# Patient Record
Sex: Female | Born: 1950 | Race: White | Hispanic: No | Smoking: Former smoker
Health system: Southern US, Community
[De-identification: ages and names within clinical notes are randomized; demographics above are authoritative.]

## PROBLEM LIST (undated history)

## (undated) DIAGNOSIS — E079 Disorder of thyroid, unspecified: Secondary | ICD-10-CM

## (undated) DIAGNOSIS — N390 Urinary tract infection, site not specified: Secondary | ICD-10-CM

## (undated) DIAGNOSIS — M1990 Unspecified osteoarthritis, unspecified site: Secondary | ICD-10-CM

## (undated) DIAGNOSIS — K635 Polyp of colon: Secondary | ICD-10-CM

## (undated) DIAGNOSIS — R011 Cardiac murmur, unspecified: Secondary | ICD-10-CM

## (undated) DIAGNOSIS — M8580 Other specified disorders of bone density and structure, unspecified site: Secondary | ICD-10-CM

## (undated) HISTORY — PX: BREAST CYST ASPIRATION: SHX578

## (undated) HISTORY — PX: TUBAL LIGATION: SHX77

## (undated) HISTORY — PX: FINGER SURGERY: SHX640

## (undated) HISTORY — DX: Polyp of colon: K63.5

## (undated) HISTORY — DX: Urinary tract infection, site not specified: N39.0

## (undated) HISTORY — DX: Disorder of thyroid, unspecified: E07.9

## (undated) HISTORY — PX: WRIST SURGERY: SHX841

## (undated) HISTORY — PX: TONSILLECTOMY: SUR1361

## (undated) HISTORY — PX: BREAST EXCISIONAL BIOPSY: SUR124

## (undated) HISTORY — DX: Unspecified osteoarthritis, unspecified site: M19.90

## (undated) HISTORY — DX: Cardiac murmur, unspecified: R01.1

## (undated) HISTORY — DX: Other specified disorders of bone density and structure, unspecified site: M85.80

---

## 1999-09-24 ENCOUNTER — Encounter: Payer: Self-pay | Admitting: Obstetrics and Gynecology

## 1999-09-24 ENCOUNTER — Encounter: Admission: RE | Admit: 1999-09-24 | Discharge: 1999-09-24 | Payer: Self-pay | Admitting: Obstetrics and Gynecology

## 2006-03-25 ENCOUNTER — Encounter: Admission: RE | Admit: 2006-03-25 | Discharge: 2006-03-25 | Payer: Self-pay | Admitting: Obstetrics and Gynecology

## 2006-09-15 ENCOUNTER — Encounter: Admission: RE | Admit: 2006-09-15 | Discharge: 2006-09-15 | Payer: Self-pay | Admitting: Obstetrics and Gynecology

## 2007-03-18 ENCOUNTER — Encounter: Admission: RE | Admit: 2007-03-18 | Discharge: 2007-03-18 | Payer: Self-pay | Admitting: Obstetrics and Gynecology

## 2008-03-22 ENCOUNTER — Encounter: Admission: RE | Admit: 2008-03-22 | Discharge: 2008-03-22 | Payer: Self-pay | Admitting: Obstetrics and Gynecology

## 2009-03-27 ENCOUNTER — Encounter: Admission: RE | Admit: 2009-03-27 | Discharge: 2009-03-27 | Payer: Self-pay | Admitting: Obstetrics and Gynecology

## 2010-03-28 ENCOUNTER — Encounter
Admission: RE | Admit: 2010-03-28 | Discharge: 2010-03-28 | Payer: Self-pay | Source: Home / Self Care | Attending: Obstetrics and Gynecology | Admitting: Obstetrics and Gynecology

## 2010-06-03 ENCOUNTER — Encounter: Payer: Self-pay | Admitting: Internal Medicine

## 2010-06-04 ENCOUNTER — Encounter (INDEPENDENT_AMBULATORY_CARE_PROVIDER_SITE_OTHER): Payer: Self-pay | Admitting: *Deleted

## 2010-06-06 ENCOUNTER — Encounter: Payer: Self-pay | Admitting: Internal Medicine

## 2010-06-10 NOTE — Letter (Signed)
Summary: Pre Visit Letter Revised  [facility]  [address] [place], [place] [address]   Phone: [phone]  Fax: [phone]        06/03/2010 MRN: [id number]  Paige Rasmussen [address] [place], [place]  [address]             Procedure Date:  06-16-10 1:30pm            Dr  Marina Goodell  Direct Colon  Welcome to the Gastroenterology Division at [facility]    You are scheduled to see a nurse for your pre-procedure visit on 06-06-10 at 4pm on the 3rd floor at [facility], [address]  We ask that you try to arrive at our office 15 minutes prior to your appointment time to allow for check-in.  Please take a minute to review the attached form.  If you answer "Yes" to one or more of the questions on the first page, we ask that you call the person listed at your earliest opportunity.  If you answer "No" to all of the questions, please complete the rest of the form and bring it to your appointment.    Your nurse visit will consist of discussing your medical and surgical history, your immediate family medical history, and your medications.   If you are unable to list all of your medications on the form, please bring the medication bottles to your appointment and we will list them.  We will need to be aware of both prescribed and over the counter drugs.  We will need to know exact dosage information as well.    Please be prepared to read and sign documents such as consent forms, a financial agreement, and acknowledgement forms.  If necessary, and with your consent, a friend or relative is welcome to sit-in on the nurse visit with you.  Please bring your insurance card so that we may make a copy of it.  If your insurance requires a referral to see a specialist, please bring your referral form from your primary care physician.  No co-pay is required for this nurse visit.     If you cannot keep your appointment, please call [phone] to cancel or reschedule prior to  your appointment date.  This allows [place] the opportunity to schedule an appointment for another patient in need of care.    Thank you for choosing [facility] for your medical needs.  We appreciate the opportunity to care for you.  Please visit [place] at our website  to learn more about our practice.  Sincerely, The Gastroenterology Division

## 2010-06-10 NOTE — Letter (Signed)
Summary: Moviprep Instructions  [facility]  [address]   [place], [place] [address]   Phone: [phone]  Fax: [phone]       Paige Rasmussen    1951-04-12    MRN: [id number]        Procedure Day /Date: Monday, 06-16-10     Arrival Time: 12:30 p.m.     Procedure Time: 1:30 p.m.     Location of Procedure:                    x   [facility] (4th Floor)   PREPARATION FOR COLONOSCOPY WITH MOVIPREP   Starting 5 days prior to your procedure 06-11-10 do not eat nuts, seeds, popcorn, corn, beans, peas,  salads, or any raw vegetables.  Do not take any fiber supplements (e.g. Metamucil, Citrucel, and Benefiber).  THE DAY BEFORE YOUR PROCEDURE         DATE: 06-15-10   DAY: Sunday  1.  Drink clear liquids the entire day-NO SOLID FOOD  2.  Do not drink anything colored red or purple.  Avoid juices with pulp.  No orange juice.  3.  Drink at least 64 oz. (8 glasses) of fluid/clear liquids during the day to prevent dehydration and help the prep work efficiently.  CLEAR LIQUIDS INCLUDE: Water Jello Ice Popsicles Tea (sugar ok, no milk/cream) Powdered fruit flavored drinks Coffee (sugar ok, no milk/cream) Gatorade Juice: apple, white grape, white cranberry  Lemonade Clear bullion, consomm, broth Carbonated beverages (any kind) Strained chicken noodle soup Hard Candy                             4.  In the morning, mix first dose of MoviPrep solution:    Empty 1 Pouch A and 1 Pouch B into the disposable container    Add lukewarm drinking water to the top line of the container. Mix to dissolve    Refrigerate (mixed solution should be used within 24 hrs)  5.  Begin drinking the prep at 5:00 p.m. The MoviPrep container is divided by 4 marks.   Every 15 minutes drink the solution down to the next mark (approximately 8 oz) until the full liter is complete.   6.  Follow completed prep with 16 oz of clear liquid of your choice (Nothing red or purple).   Continue to drink clear liquids until bedtime.  7.  Before going to bed, mix second dose of MoviPrep solution:    Empty 1 Pouch A and 1 Pouch B into the disposable container    Add lukewarm drinking water to the top line of the container. Mix to dissolve           Refrigerate THE DAY OF YOUR PROCEDURE      DATE: 06-16-10  DAY: Monday  Beginning at 8:30 a.m. (5 hours before procedure):         1. Every 15 minutes, drink the solution down to the next mark (approx 8 oz) until the full liter is complete.  2. Follow completed prep with 16 oz. of clear liquid of your choice.    3. You may drink clear liquids until 11:30 a.m. (2 HOURS BEFORE PROCEDURE).   MEDICATION INSTRUCTIONS  Unless otherwise instructed, you should take regular prescription medications with a small sip of water   as early as possible the morning of your procedure.          OTHER INSTRUCTIONS  You will need a responsible adult at least 62 years of age to accompany you and drive you home.   This person must remain in the waiting room during your procedure.  Wear loose fitting clothing that is easily removed.  Leave jewelry and other valuables at home.  However, you may wish to bring a book to read or  an iPod/MP3 player to listen to music as you wait for your procedure to start.  Remove all body piercing jewelry and leave at home.  Total time from sign-in until discharge is approximately 2-3 hours.  You should go home directly after your procedure and rest.  You can resume normal activities the  day after your procedure.  The day of your procedure you should not:   Drive   Make legal decisions   Operate machinery   Drink alcohol   Return to work  You will receive specific instructions about eating, activities and medications before you leave.    The above instructions have been reviewed and explained to me by   Ezra Sites RN  June 06, 2010 4:23 PM     I fully understand and can  verbalize these instructions _____________________________ Date _________

## 2010-06-10 NOTE — Miscellaneous (Signed)
Summary: [facility]  Clinical Lists Changes  Medications: Added new medication of MOVIPREP 100 GM  SOLR (PEG-KCL-NACL-NASULF-NA ASC-C) As per prep instructions. - Signed Rx of MOVIPREP 100 GM  SOLR (PEG-KCL-NACL-NASULF-NA ASC-C) As per prep instructions.;  #1 x 0;  Signed;  Entered by: Ezra Sites RN;  Authorized by: Hilarie Fredrickson MD;  Method used: Electronically to [facility] [address] # [id number]*, [address] [place], [place], [place]  [address], Ph: [phone] or [phone], Fax: [phone] Allergies: Added new allergy or adverse reaction of PCN Added new allergy or adverse reaction of CODEINE Observations: Added new observation of NKA: F (06/06/2010 15:57)    Prescriptions: MOVIPREP 100 GM  SOLR (PEG-KCL-NACL-NASULF-NA ASC-C) As per prep instructions.  #1 x 0   Entered by:   Ezra Sites RN   Authorized by:   Hilarie Fredrickson MD   Signed by:   Ezra Sites RN on 06/06/2010   Method used:   Electronically to        [facility] [address] [id number]* (retail)       [address]       [place], [place]  [address]       Ph: [phone] or [phone]       Fax: [phone]   RxID:   [id number]

## 2010-06-16 ENCOUNTER — Other Ambulatory Visit: Payer: Self-pay | Admitting: Internal Medicine

## 2010-06-16 ENCOUNTER — Other Ambulatory Visit (AMBULATORY_SURGERY_CENTER): Payer: PRIVATE HEALTH INSURANCE | Admitting: Internal Medicine

## 2010-06-16 DIAGNOSIS — D126 Benign neoplasm of colon, unspecified: Secondary | ICD-10-CM

## 2010-06-16 DIAGNOSIS — Z1211 Encounter for screening for malignant neoplasm of colon: Secondary | ICD-10-CM

## 2010-06-16 DIAGNOSIS — K5730 Diverticulosis of large intestine without perforation or abscess without bleeding: Secondary | ICD-10-CM

## 2010-06-24 NOTE — Procedures (Addendum)
Summary: Colonoscopy  Patient: [name] Note: All result statuses are Final unless otherwise noted.  Tests: (1) Colonoscopy (COL)   COL Colonoscopy           DONE     [facility]     [address]     [place], [place]  [address]          COLONOSCOPY PROCEDURE REPORT          PATIENT:  Paige Rasmussen, Paige Rasmussen  MR#:  [id number]     BIRTHDATE:  [birth year], 59 yrs. old  GENDER:  female     ENDOSCOPIST:  Wilhemina Bonito. Eda Keys, MD     REF. BY:  Kyra Manges, M.D.     PROCEDURE DATE:  06/16/2010     PROCEDURE:  Colonoscopy with snare polypectomy x 1     ASA CLASS:  Class II     INDICATIONS:  Routine Risk Screening     MEDICATIONS:   Fentanyl 100 mcg IV, Versed 10 mg IV          DESCRIPTION OF PROCEDURE:   After the risks benefits and     alternatives of the procedure were thoroughly explained, informed     consent was obtained.  Digital rectal exam was performed and     revealed no abnormalities.   The LB 180AL [id number] endoscope was     introduced through the anus and advanced to the cecum, which was     identified by both the appendix and ileocecal valve, without     limitations.Time to cecu  = 6:13 min. The quality of the prep was     excellent, using MoviPrep.  The instrument was then slowly     withdrawn (time = 12:24 min) as the colon was fully examined.     <<PROCEDUREIMAGES>>          FINDINGS:  A diminutive polyp was found in the ascending colon.     Polyp was snared without cautery. Retrieval was successful.  Mild     diverticulosis was found in the sigmoid colon.  Otherwise normal     colonoscopy without other polyps, masses, vascular ectasias, or     inflammatory changes.   Retroflexed views in the rectum revealed     no abnormalities.    The scope was then withdrawn from the patient     and the procedure completed.          COMPLICATIONS:  None          ENDOSCOPIC IMPRESSION:     1) Diminutive polyp in the ascending colon - removed     2) Mild  diverticulosis in the sigmoid colon     3) Otherwise normal colonoscopy          RECOMMENDATIONS:     1) Repeat colonoscopy in 5 years if polyp adenomatous; otherwise     10 years          ______________________________     Wilhemina Bonito. Eda Keys, MD          CC:  Kyra Manges, MD;  The Patient          n.     eSIGNED:   Wilhemina Bonito. Eda Keys at 06/16/2010 02:17 PM          Shanon Payor, [id number]  Note: An exclamation mark (!) indicates a result that was not dispersed into the flowsheet. Document Creation Date: 06/16/2010 2:18 PM _______________________________________________________________________  (1) Order result status: Final  Collection or observation date-time: 06/16/2010 14:12 Requested date-time:  Receipt date-time:  Reported date-time:  Referring Physician:   Ordering Physician: Fransico Setters [id number]) Specimen Source:  Source: Launa Grill Order Number: [id number] Lab site:   Appended Document: Colonoscopy     Procedures Next Due Date:    Colonoscopy: 06/2015

## 2010-06-28 ENCOUNTER — Encounter: Payer: Self-pay | Admitting: Internal Medicine

## 2010-07-01 NOTE — Letter (Addendum)
Summary: Patient Notice- Polyp Results   Gastroenterology  [address] [place], [place] [address]   Phone: [phone]  Fax: [phone]        June 28, 2010 MRN: [id number]    Paige Rasmussen [address] [place], [place]  [address]    Dear Ms. Clauson,  I am pleased to inform you that the colon polyp(s) removed during your recent colonoscopy was (were) found to be benign (no cancer detected) upon pathologic examination.  I recommend you have a repeat colonoscopy examination in 5 years to look for recurrent polyps, as having colon polyps increases your risk for having recurrent polyps or even colon cancer in the future.  Should you develop new or worsening symptoms of abdominal pain, bowel habit changes or bleeding from the rectum or bowels, please schedule an evaluation with either your primary care physician or with me.  Additional information/recommendations:  __ No further action with gastroenterology is needed at this time. Please      follow-up with your primary care physician for your other healthcare      needs. .  Please call us if you are having persistent problems or have questions about your condition that have not been fully answered at this time.  Sincerely,  Hilarie Fredrickson MD  This letter has been electronically signed by your physician.  Appended Document: Patient Notice- Polyp Results letter mailed

## 2011-02-24 ENCOUNTER — Other Ambulatory Visit: Payer: Self-pay | Admitting: Physician Assistant

## 2011-02-24 DIAGNOSIS — Z1231 Encounter for screening mammogram for malignant neoplasm of breast: Secondary | ICD-10-CM

## 2011-03-31 ENCOUNTER — Ambulatory Visit
Admission: RE | Admit: 2011-03-31 | Discharge: 2011-03-31 | Disposition: A | Discharge status: Home / Self Care | Payer: PRIVATE HEALTH INSURANCE | Source: Ambulatory Visit | Attending: Physician Assistant | Admitting: Physician Assistant

## 2011-03-31 DIAGNOSIS — Z1231 Encounter for screening mammogram for malignant neoplasm of breast: Secondary | ICD-10-CM

## 2012-02-24 ENCOUNTER — Other Ambulatory Visit: Payer: Self-pay | Admitting: Physician Assistant

## 2012-02-24 DIAGNOSIS — Z1231 Encounter for screening mammogram for malignant neoplasm of breast: Secondary | ICD-10-CM

## 2012-04-04 ENCOUNTER — Ambulatory Visit
Admission: RE | Admit: 2012-04-04 | Discharge: 2012-04-04 | Disposition: A | Discharge status: Home / Self Care | Payer: BC Managed Care – PPO | Source: Ambulatory Visit | Attending: Physician Assistant | Admitting: Physician Assistant

## 2012-04-04 DIAGNOSIS — Z1231 Encounter for screening mammogram for malignant neoplasm of breast: Secondary | ICD-10-CM

## 2012-11-18 ENCOUNTER — Other Ambulatory Visit: Payer: Self-pay | Admitting: Physician Assistant

## 2012-11-18 NOTE — Telephone Encounter (Signed)
Medication refilled per protocol. 

## 2012-11-29 ENCOUNTER — Ambulatory Visit (INDEPENDENT_AMBULATORY_CARE_PROVIDER_SITE_OTHER): Payer: BC Managed Care – PPO | Admitting: Family Medicine

## 2012-11-29 ENCOUNTER — Encounter: Payer: Self-pay | Admitting: Family Medicine

## 2012-11-29 VITALS — BP 160/90 | HR 76 | Temp 98.0°F | Resp 18 | Ht 64.5 in | Wt 152.0 lb

## 2012-11-29 DIAGNOSIS — IMO0001 Reserved for inherently not codable concepts without codable children: Secondary | ICD-10-CM

## 2012-11-29 DIAGNOSIS — E039 Hypothyroidism, unspecified: Secondary | ICD-10-CM | POA: Insufficient documentation

## 2012-11-29 DIAGNOSIS — R030 Elevated blood-pressure reading, without diagnosis of hypertension: Secondary | ICD-10-CM

## 2012-11-29 DIAGNOSIS — R300 Dysuria: Secondary | ICD-10-CM

## 2012-11-29 DIAGNOSIS — N390 Urinary tract infection, site not specified: Secondary | ICD-10-CM

## 2012-11-29 LAB — URINALYSIS, ROUTINE W REFLEX MICROSCOPIC
Ketones, ur: NEGATIVE mg/dL
Nitrite: NEGATIVE
Specific Gravity, Urine: 1.01 (ref 1.005–1.030)
Urobilinogen, UA: 0.2 mg/dL (ref 0.0–1.0)

## 2012-11-29 LAB — URINALYSIS, MICROSCOPIC ONLY
Bacteria, UA: NONE SEEN
Crystals: NONE SEEN

## 2012-11-29 MED ORDER — CIPROFLOXACIN HCL 500 MG PO TABS: 500.0000 mg | ORAL_TABLET | Freq: Two times a day (BID) | ORAL | Status: DC

## 2012-11-29 MED ORDER — PHENAZOPYRIDINE HCL 100 MG PO TABS: 100.0000 mg | ORAL_TABLET | Freq: Three times a day (TID) | ORAL | Status: DC | PRN

## 2012-11-29 NOTE — Assessment & Plan Note (Signed)
BP elevated today, reviewed her chart BP typically 130/80 past few years, under a lot of stress, no weight gain, normal labs in December  Will recheck in 6 weeks and decide if medications needed

## 2012-11-29 NOTE — Progress Notes (Signed)
  Subjective:    Patient ID: Paige Rasmussen, female    DOB: 02/20/1951, 62 y.o.   MRN: [id number]  HPI  Patient here with dysuria and urinary pressure for the past 2 weeks. She's been taking cranberry juice at home. She denies any abdominal pain nausea vomiting. She denies any fever. Medications reviewed she's taking multivitamins as well as her Synthroid [place] and has been under a larger slightly also helping with her father-in-law who was recently diagnosed with mild dementia. Seeing Dr. Mina Marble for fractured finger, has splint on  Review of Systems  GEN- denies fatigue, fever, weight loss,weakness, recent illness CVS- denies chest pain, palpitations RESP- denies SOB, cough, wheeze ABD- denies N/V, change in stools, abd pain GU- + dysuria, denies hematuria, dribbling, incontinence Neuro- denies headache, dizziness, syncope, seizure activity      Objective:   Physical Exam GEN- NAD, alert and oriented x3 Neck- Supple, no thryomegaly CVS- RRR, no murmur RESP-CTAB ABD-NABS,soft,NT,ND,no CVA tenderness EXT- No edema Pulses- Radial 2+  Splint in 4th digit left hand      Assessment & Plan:

## 2012-11-29 NOTE — Patient Instructions (Signed)
Take the antibiotics as prescribed  Drink plenty of water F/U 6 weeks for blood pressure

## 2012-11-29 NOTE — Assessment & Plan Note (Signed)
Treat with Cipro and pyridium for the pressure

## 2013-01-10 ENCOUNTER — Ambulatory Visit (INDEPENDENT_AMBULATORY_CARE_PROVIDER_SITE_OTHER): Payer: BC Managed Care – PPO | Admitting: Family Medicine

## 2013-01-10 ENCOUNTER — Encounter: Payer: Self-pay | Admitting: Family Medicine

## 2013-01-10 VITALS — BP 130/78 | HR 80 | Temp 97.9°F | Resp 18 | Ht 65.0 in | Wt 155.0 lb

## 2013-01-10 DIAGNOSIS — E039 Hypothyroidism, unspecified: Secondary | ICD-10-CM

## 2013-01-10 DIAGNOSIS — IMO0001 Reserved for inherently not codable concepts without codable children: Secondary | ICD-10-CM

## 2013-01-10 DIAGNOSIS — R030 Elevated blood-pressure reading, without diagnosis of hypertension: Secondary | ICD-10-CM

## 2013-01-10 DIAGNOSIS — Z0000 Encounter for general adult medical examination without abnormal findings: Secondary | ICD-10-CM

## 2013-01-10 MED ORDER — LEVOTHYROXINE SODIUM 75 MCG PO TABS: 75.0000 ug | ORAL_TABLET | Freq: Every day | ORAL | Status: DC

## 2013-01-10 NOTE — Assessment & Plan Note (Signed)
Repeat blood pressure much improved, home readings normal

## 2013-01-10 NOTE — Progress Notes (Signed)
  Subjective:    Patient ID: Paige Rasmussen, female    DOB: 1950-12-03, 62 y.o.   MRN: [id number]  HPI Patient here to followup elevated blood pressure at last visit. She's been checking blood pressure at home her systolic is ranging 120s to 130s over 70s to 80s. She has no diagnoses of high blood pressure.  She is also inquiring when her last physical was on her last colonoscopy was She also is history of hypothyroidism and her Synthroid is causing her arm is $30 a month brand name she would like to change this to generic levothyroxine   Review of Systems  GEN- denies fatigue, fever, weight loss,weakness, recent illness HEENT- denies eye drainage, change in vision, nasal discharge, CVS- denies chest pain, palpitations RESP- denies SOB, cough, wheeze ABD- denies N/V, change in stools, abd pain GU- denies dysuria, hematuria, dribbling, incontinence MSK- denies joint pain, muscle aches, injury Neuro- denies headache, dizziness, syncope, seizure activity      Objective:   Physical Exam GEN- NAD, alert and oriented x3 CVS- RRR, no murmur RESP-CTAB Pulses- Radial 2+        Assessment & Plan:

## 2013-01-10 NOTE — Assessment & Plan Note (Signed)
Change to levothyroxine REcheck labs in 3 months before CPE   PAP Smear will not be needed Colonoscopy UTD,

## 2013-04-14 ENCOUNTER — Encounter: Payer: BC Managed Care – PPO | Admitting: Family Medicine

## 2013-05-02 ENCOUNTER — Encounter: Payer: Self-pay | Admitting: Family Medicine

## 2013-05-04 ENCOUNTER — Other Ambulatory Visit: Payer: BC Managed Care – PPO

## 2013-05-04 DIAGNOSIS — Z0000 Encounter for general adult medical examination without abnormal findings: Secondary | ICD-10-CM

## 2013-05-04 DIAGNOSIS — E039 Hypothyroidism, unspecified: Secondary | ICD-10-CM

## 2013-05-04 LAB — CBC WITH DIFFERENTIAL/PLATELET
BASOS PCT: 0 % (ref 0–1)
Basophils Absolute: 0 10*3/uL (ref 0.0–0.1)
EOS ABS: 0.4 10*3/uL (ref 0.0–0.7)
EOS PCT: 5 % (ref 0–5)
HEMATOCRIT: 37 % (ref 36.0–46.0)
HEMOGLOBIN: 12.4 g/dL (ref 12.0–15.0)
LYMPHS ABS: 1.6 10*3/uL (ref 0.7–4.0)
Lymphocytes Relative: 21 % (ref 12–46)
MCH: 29.5 pg (ref 26.0–34.0)
MCHC: 33.5 g/dL (ref 30.0–36.0)
MCV: 88.1 fL (ref 78.0–100.0)
MONO ABS: 0.6 10*3/uL (ref 0.1–1.0)
Monocytes Relative: 8 % (ref 3–12)
Neutro Abs: 5 10*3/uL (ref 1.7–7.7)
Neutrophils Relative %: 66 % (ref 43–77)
Platelets: 237 10*3/uL (ref 150–400)
RBC: 4.2 MIL/uL (ref 3.87–5.11)
RDW: 13.7 % (ref 11.5–15.5)
WBC: 7.6 10*3/uL (ref 4.0–10.5)

## 2013-05-04 LAB — COMPREHENSIVE METABOLIC PANEL
ALBUMIN: 4 g/dL (ref 3.5–5.2)
ALK PHOS: 68 U/L (ref 39–117)
ALT: 11 U/L (ref 0–35)
AST: 20 U/L (ref 0–37)
BILIRUBIN TOTAL: 0.4 mg/dL (ref 0.3–1.2)
BUN: 12 mg/dL (ref 6–23)
CO2: 29 mEq/L (ref 19–32)
CREATININE: 0.66 mg/dL (ref 0.50–1.10)
Calcium: 9.4 mg/dL (ref 8.4–10.5)
Chloride: 104 mEq/L (ref 96–112)
Glucose, Bld: 92 mg/dL (ref 70–99)
POTASSIUM: 4.8 meq/L (ref 3.5–5.3)
Sodium: 141 mEq/L (ref 135–145)
Total Protein: 6.7 g/dL (ref 6.0–8.3)

## 2013-05-04 LAB — LIPID PANEL
CHOL/HDL RATIO: 3 ratio
Cholesterol: 193 mg/dL (ref 0–200)
HDL: 65 mg/dL (ref >39)
LDL CALC: 107 mg/dL — AB (ref 0–99)
Triglycerides: 105 mg/dL (ref <150)
VLDL: 21 mg/dL (ref 0–40)

## 2013-05-04 LAB — T3, FREE: T3 FREE: 2.8 pg/mL (ref 2.3–4.2)

## 2013-05-04 LAB — TSH: TSH: 5.081 u[IU]/mL — ABNORMAL HIGH (ref 0.350–4.500)

## 2013-05-04 LAB — T4, FREE: FREE T4: 1.34 ng/dL (ref 0.80–1.80)

## 2013-05-05 ENCOUNTER — Encounter: Payer: BC Managed Care – PPO | Admitting: Family Medicine

## 2013-05-10 ENCOUNTER — Encounter: Payer: Self-pay | Admitting: Family Medicine

## 2013-05-10 ENCOUNTER — Ambulatory Visit (INDEPENDENT_AMBULATORY_CARE_PROVIDER_SITE_OTHER): Payer: BC Managed Care – PPO | Admitting: Family Medicine

## 2013-05-10 ENCOUNTER — Other Ambulatory Visit: Payer: Self-pay

## 2013-05-10 VITALS — BP 120/78 | HR 82 | Temp 98.2°F | Resp 18 | Ht 66.0 in | Wt 156.0 lb

## 2013-05-10 DIAGNOSIS — E039 Hypothyroidism, unspecified: Secondary | ICD-10-CM

## 2013-05-10 DIAGNOSIS — Z1231 Encounter for screening mammogram for malignant neoplasm of breast: Secondary | ICD-10-CM

## 2013-05-10 DIAGNOSIS — Z13820 Encounter for screening for osteoporosis: Secondary | ICD-10-CM

## 2013-05-10 DIAGNOSIS — Z0000 Encounter for general adult medical examination without abnormal findings: Secondary | ICD-10-CM

## 2013-05-10 MED ORDER — LEVOTHYROXINE SODIUM 88 MCG PO TABS: 88.0000 ug | ORAL_TABLET | Freq: Every day | ORAL | Status: DC

## 2013-05-10 NOTE — Progress Notes (Signed)
   Subjective:    Patient ID: Paige Rasmussen, female    DOB: 10/01/1950, 63 y.o.   MRN: [id number]  HPI History here for complete physical exam. She has no specific concerns. She's been taking her thyroid medication as prescribed. She is due to review her fasting labs. Her immunizations are up-to-date. She does not have a mammogram in 2014 because she received a letter from her insurance stated they would only pay for it every 2 years. She's not had a bone density test. She is taking calcium and vitamin D on a regular basis and tries to stay active. She had a recent Pap smear that was normal therefore she is not due for Pap today Colonoscopy up to date   Review of Systems   GEN- denies fatigue, fever, weight loss,weakness, recent illness HEENT- denies eye drainage, change in vision, nasal discharge, CVS- denies chest pain, palpitations RESP- denies SOB, cough, wheeze ABD- denies N/V, change in stools, abd pain GU- denies dysuria, hematuria, dribbling, incontinence MSK- denies joint pain, muscle aches, injury Neuro- denies headache, dizziness, syncope, seizure activity      Objective:   Physical Exam GEN- NAD, alert and oriented x3 HEENT- PERRL, EOMI, non injected sclera, pink conjunctiva, MMM, oropharynx clear Neck- Supple, no thryomegaly CVS- RRR, no murmur RESP-CTAB ABD-NABS,soft,NT,ND GU- Deferred EXT- No edema Pulses- Radial, DP- 2+        Assessment & Plan:    CPE- PAP not neeed until 2016, Colonoscopy due 2017, pt to check with insurance will need Mammogram and Bone Density. Reviewed fasting labs  Immunizations UTD

## 2013-05-10 NOTE — Patient Instructions (Signed)
New dose of synthroid medication Work on low fat diet  Look into Mammogram and Bone Density F/U 6 weeks for lab for repeat testing

## 2013-05-11 NOTE — Assessment & Plan Note (Signed)
She has had some mild fatigue recently with her elevated TSH I will increase her Synthroid to 88 mcg and will return in 6 weeks to have her labs repeated

## 2013-05-22 ENCOUNTER — Other Ambulatory Visit: Payer: BC Managed Care – PPO

## 2013-06-01 ENCOUNTER — Ambulatory Visit: Payer: BC Managed Care – PPO

## 2013-06-01 ENCOUNTER — Ambulatory Visit
Admission: RE | Admit: 2013-06-01 | Discharge: 2013-06-01 | Disposition: A | Discharge status: Home / Self Care | Payer: BC Managed Care – PPO | Source: Ambulatory Visit

## 2013-06-01 DIAGNOSIS — Z1231 Encounter for screening mammogram for malignant neoplasm of breast: Secondary | ICD-10-CM

## 2013-06-21 ENCOUNTER — Other Ambulatory Visit: Payer: BC Managed Care – PPO

## 2013-06-21 DIAGNOSIS — E039 Hypothyroidism, unspecified: Secondary | ICD-10-CM

## 2013-06-21 LAB — T3, FREE: T3, Free: 3 pg/mL (ref 2.3–4.2)

## 2013-06-21 LAB — TSH: TSH: 3.323 u[IU]/mL (ref 0.350–4.500)

## 2013-06-22 ENCOUNTER — Encounter: Payer: Self-pay | Admitting: *Deleted

## 2013-11-07 ENCOUNTER — Other Ambulatory Visit: Payer: Self-pay | Admitting: *Deleted

## 2013-11-07 DIAGNOSIS — E039 Hypothyroidism, unspecified: Secondary | ICD-10-CM

## 2013-11-07 MED ORDER — LEVOTHYROXINE SODIUM 88 MCG PO TABS: 88.0000 ug | ORAL_TABLET | Freq: Every day | ORAL | Status: DC

## 2013-11-07 NOTE — Telephone Encounter (Signed)
Refill appropriate and filled per protocol. 

## 2014-02-02 ENCOUNTER — Encounter: Payer: Self-pay | Admitting: Family Medicine

## 2014-02-13 ENCOUNTER — Other Ambulatory Visit: Payer: Self-pay | Admitting: *Deleted

## 2014-02-13 DIAGNOSIS — E039 Hypothyroidism, unspecified: Secondary | ICD-10-CM

## 2014-02-13 MED ORDER — LEVOTHYROXINE SODIUM 88 MCG PO TABS: 88.0000 ug | ORAL_TABLET | Freq: Every day | ORAL | Status: DC

## 2014-02-13 NOTE — Telephone Encounter (Signed)
Received fax requesting refill on levothyroxine.   Refill appropriate and filled per protocol.

## 2014-06-06 ENCOUNTER — Encounter: Payer: Self-pay | Admitting: Family Medicine

## 2014-06-11 ENCOUNTER — Other Ambulatory Visit: Payer: PPO

## 2014-06-11 ENCOUNTER — Other Ambulatory Visit: Payer: Self-pay | Admitting: Family Medicine

## 2014-06-11 DIAGNOSIS — IMO0001 Reserved for inherently not codable concepts without codable children: Secondary | ICD-10-CM

## 2014-06-11 DIAGNOSIS — Z79899 Other long term (current) drug therapy: Secondary | ICD-10-CM

## 2014-06-11 DIAGNOSIS — E039 Hypothyroidism, unspecified: Secondary | ICD-10-CM

## 2014-06-11 DIAGNOSIS — R030 Elevated blood-pressure reading, without diagnosis of hypertension: Principal | ICD-10-CM

## 2014-06-11 DIAGNOSIS — Z0000 Encounter for general adult medical examination without abnormal findings: Secondary | ICD-10-CM

## 2014-06-11 LAB — COMPLETE METABOLIC PANEL WITH GFR
ALT: 22 U/L (ref 0–35)
AST: 23 U/L (ref 0–37)
Albumin: 4.2 g/dL (ref 3.5–5.2)
Alkaline Phosphatase: 67 U/L (ref 39–117)
BILIRUBIN TOTAL: 0.3 mg/dL (ref 0.2–1.2)
BUN: 11 mg/dL (ref 6–23)
CALCIUM: 10.2 mg/dL (ref 8.4–10.5)
CO2: 26 meq/L (ref 19–32)
CREATININE: 0.71 mg/dL (ref 0.50–1.10)
Chloride: 102 mEq/L (ref 96–112)
GLUCOSE: 99 mg/dL (ref 70–99)
Potassium: 4.5 mEq/L (ref 3.5–5.3)
Sodium: 140 mEq/L (ref 135–145)
TOTAL PROTEIN: 6.9 g/dL (ref 6.0–8.3)

## 2014-06-11 LAB — CBC WITH DIFFERENTIAL/PLATELET
BASOS ABS: 0 10*3/uL (ref 0.0–0.1)
Basophils Relative: 0 % (ref 0–1)
EOS PCT: 3 % (ref 0–5)
Eosinophils Absolute: 0.2 10*3/uL (ref 0.0–0.7)
HCT: 38.9 % (ref 36.0–46.0)
Hemoglobin: 12.7 g/dL (ref 12.0–15.0)
LYMPHS PCT: 29 % (ref 12–46)
Lymphs Abs: 1.9 10*3/uL (ref 0.7–4.0)
MCH: 29.3 pg (ref 26.0–34.0)
MCHC: 32.6 g/dL (ref 30.0–36.0)
MCV: 89.8 fL (ref 78.0–100.0)
MONO ABS: 0.5 10*3/uL (ref 0.1–1.0)
MONOS PCT: 8 % (ref 3–12)
MPV: 9 fL (ref 8.6–12.4)
NEUTROS ABS: 3.9 10*3/uL (ref 1.7–7.7)
Neutrophils Relative %: 60 % (ref 43–77)
PLATELETS: 277 10*3/uL (ref 150–400)
RBC: 4.33 MIL/uL (ref 3.87–5.11)
RDW: 13.3 % (ref 11.5–15.5)
WBC: 6.5 10*3/uL (ref 4.0–10.5)

## 2014-06-11 LAB — LIPID PANEL
Cholesterol: 190 mg/dL (ref 0–200)
HDL: 50 mg/dL (ref >=46)
LDL CALC: 113 mg/dL — AB (ref 0–99)
TRIGLYCERIDES: 135 mg/dL (ref <150)
Total CHOL/HDL Ratio: 3.8 Ratio
VLDL: 27 mg/dL (ref 0–40)

## 2014-06-11 LAB — TSH: TSH: 5.636 u[IU]/mL — AB (ref 0.350–4.500)

## 2014-06-12 ENCOUNTER — Other Ambulatory Visit: Payer: Self-pay | Admitting: *Deleted

## 2014-06-12 DIAGNOSIS — E039 Hypothyroidism, unspecified: Secondary | ICD-10-CM

## 2014-06-12 LAB — T3, FREE: T3 FREE: 2.7 pg/mL (ref 2.3–4.2)

## 2014-06-12 LAB — T4, FREE: FREE T4: 1.43 ng/dL (ref 0.80–1.80)

## 2014-06-12 MED ORDER — LEVOTHYROXINE SODIUM 88 MCG PO TABS: 88.0000 ug | ORAL_TABLET | Freq: Every day | ORAL | Status: DC

## 2014-06-12 NOTE — Telephone Encounter (Signed)
Medication filled x1 with no refills.   Requires office visit before any further refills can be given.   Letter sent.  

## 2014-06-18 ENCOUNTER — Ambulatory Visit (INDEPENDENT_AMBULATORY_CARE_PROVIDER_SITE_OTHER): Payer: PPO | Admitting: Family Medicine

## 2014-06-18 ENCOUNTER — Encounter: Payer: Self-pay | Admitting: Family Medicine

## 2014-06-18 ENCOUNTER — Other Ambulatory Visit: Payer: Self-pay | Admitting: Family Medicine

## 2014-06-18 VITALS — BP 122/68 | HR 74 | Temp 98.4°F | Resp 14 | Ht 66.0 in | Wt 158.0 lb

## 2014-06-18 DIAGNOSIS — E039 Hypothyroidism, unspecified: Secondary | ICD-10-CM | POA: Diagnosis not present

## 2014-06-18 DIAGNOSIS — Z0000 Encounter for general adult medical examination without abnormal findings: Secondary | ICD-10-CM | POA: Diagnosis not present

## 2014-06-18 DIAGNOSIS — M8580 Other specified disorders of bone density and structure, unspecified site: Secondary | ICD-10-CM

## 2014-06-18 DIAGNOSIS — Z13820 Encounter for screening for osteoporosis: Secondary | ICD-10-CM | POA: Diagnosis not present

## 2014-06-18 DIAGNOSIS — Z124 Encounter for screening for malignant neoplasm of cervix: Secondary | ICD-10-CM

## 2014-06-18 DIAGNOSIS — E038 Other specified hypothyroidism: Secondary | ICD-10-CM | POA: Diagnosis not present

## 2014-06-18 DIAGNOSIS — Z23 Encounter for immunization: Secondary | ICD-10-CM

## 2014-06-18 DIAGNOSIS — J302 Other seasonal allergic rhinitis: Secondary | ICD-10-CM | POA: Diagnosis not present

## 2014-06-18 DIAGNOSIS — Z1239 Encounter for other screening for malignant neoplasm of breast: Secondary | ICD-10-CM | POA: Diagnosis not present

## 2014-06-18 MED ORDER — LEVOTHYROXINE SODIUM 100 MCG PO TABS: 100.0000 ug | ORAL_TABLET | Freq: Every day | ORAL | Status: DC

## 2014-06-18 NOTE — Assessment & Plan Note (Signed)
Increase to 170mcg once a day, repeat  TFT in 8 weeks

## 2014-06-18 NOTE — Progress Notes (Signed)
Patient ID: Paige Rasmussen, female   DOB: [birth year], 64 y.o.   MRN: [id number]    Subjective:   Patient presents for Medicare Annual/Subsequent preventive examination.    Patient here for annual physical/wellness exam. She turns 64 recently. She is due for Pap smear her last was 3 years ago. She is also due for bone density. She is also due for Prevnar 13.  She has history of hypothyroidism she's currently on Synthroid 88 g. She has felt more fatigued recently which feels a little abnormal for her. She denies any chest pain shortness of breath palpitations. There is been no significant weight changes. No changes with heat or cold intolerance. Her last reviewed which did show abnormality of her thyroid function tests Review Past Medical/Family/Social: Per EMR   Risk Factors  Current exercise habits: walks Dietary issues discussed: Yes  Cardiac risk factors: None   Depression Screen  (Note: if answer to either of the following is "Yes", a more complete depression screening is indicated)  Over the past two weeks, have you felt down, depressed or hopeless? No Over the past two weeks, have you felt little interest or pleasure in doing things? No Have you lost interest or pleasure in daily life? No Do you often feel hopeless? No Do you cry easily over simple problems? No   Activities of Daily Living  In your present state of health, do you have any difficulty performing the following activities?:  Driving? No  Managing money? No  Feeding yourself? No  Getting from bed to chair? No  Climbing a flight of stairs? No  Preparing food and eating?: No  Bathing or showering? No  Getting dressed: No  Getting to the toilet? No  Using the toilet:No  Moving around from place to place: No  In the past year have you fallen or had a near fall?:No  Are you sexually active? No  Do you have more than one partner? No   Hearing Difficulties: No  Do you often ask people to speak up or repeat  themselves? No  Do you experience ringing or noises in your ears? No Do you have difficulty understanding soft or whispered voices? No  Do you feel that you have a problem with memory? No Do you often misplace items? No  Do you feel safe at home? Yes  Cognitive Testing  Alert? Yes Normal Appearance?Yes  Oriented to person? Yes Place? Yes  Time? Yes  Recall of three objects? Yes  Can perform simple calculations? Yes  Displays appropriate judgment?Yes  Can read the correct time from a watch face?Yes   List the Names of Other Physician/Practitioners you currently use:  NONE  Screening Tests / Date Colonoscopy     -UTD                Zostavax UTD Mammogram -DUE Tetanus/tdap -UTD  ROS: GEN- + fatigue, denies fever, weight loss,weakness, recent illness HEENT- denies eye drainage, change in vision, +nasal discharge, CVS- denies chest pain, palpitations RESP- denies SOB, cough, wheeze ABD- denies N/V, change in stools, abd pain GU- denies dysuria, hematuria, dribbling, incontinence MSK- denies joint pain, muscle aches, injury Neuro- denies headache, dizziness, syncope, seizure activity  PHYSICAL: GEN- NAD, alert and oriented x3 HEENT- PERRL, EOMI, non injected sclera, pink conjunctiva, MMM, oropharynx clear Neck- Supple, no thryomegaly Breast- normal symmetry, no nipple inversion,no nipple drainage, no nodules or lumps felt Nodes- no axillary nodes CVS- RRR, soft 1/6 SEM left sternal border RESP-CTAB ABD-NABS,soft,NT,ND GU- normal  external genitalia, vaginal mucosa pink and moist, cervix visualized no growth, no blood form os+ atrophy of cervix, no CMT, no ovarian masses, uterus normal size EXT- No edema Pulses- Radial, DP- 2+    Assessment:    Annual wellness medicare exam   Plan:    During the course of the visit the patient was educated and counseled about appropriate screening and preventive services including:  Screening mammography  Colorectal cancer screening  - due 2017 prevar 13 given  Bone density to be scheduled  No further PAP if normal this time due to age   Patient Instructions (the written plan) was given to the patient.  Medicare Attestation  I have personally reviewed:  The patient's medical and social history  Their use of alcohol, tobacco or illicit drugs  Their current medications and supplements  The patient's functional ability including ADLs,fall risks, home safety risks, cognitive, and hearing and visual impairment  Diet and physical activities  Evidence for depression or mood disorders  The patient's weight, height, BMI, and visual acuity have been recorded in the chart. I have made referrals, counseling, and provided education to the patient based on review of the above and I have provided the patient with a written personalized care plan for preventive services.

## 2014-06-18 NOTE — Patient Instructions (Signed)
Try zyrtec or claritin for allergies Synthroid increased to 163mcg once a day  Return in 8 weeks for repeat thyroid labs Call and schedule your Bone Density and Mammogram Prevnar 13 given today  F/U 1 year or as needed

## 2014-06-19 ENCOUNTER — Encounter: Payer: Self-pay | Admitting: *Deleted

## 2014-06-19 LAB — PAP THINPREP ASCUS RFLX HPV RFLX TYPE

## 2014-06-21 ENCOUNTER — Other Ambulatory Visit: Payer: Self-pay | Admitting: *Deleted

## 2014-06-21 DIAGNOSIS — E039 Hypothyroidism, unspecified: Secondary | ICD-10-CM

## 2014-06-21 MED ORDER — LEVOTHYROXINE SODIUM 100 MCG PO TABS: 100.0000 ug | ORAL_TABLET | Freq: Every day | ORAL | Status: DC

## 2014-06-21 NOTE — Telephone Encounter (Signed)
Received fax requesting refill on Synthroid with 90 day supply.   Refill appropriate and filled per protocol.

## 2014-06-29 ENCOUNTER — Ambulatory Visit
Admission: RE | Admit: 2014-06-29 | Discharge: 2014-06-29 | Disposition: A | Discharge status: Home / Self Care | Payer: PPO | Source: Ambulatory Visit | Attending: Family Medicine | Admitting: Family Medicine

## 2014-06-29 DIAGNOSIS — M8580 Other specified disorders of bone density and structure, unspecified site: Secondary | ICD-10-CM

## 2014-06-29 DIAGNOSIS — Z1239 Encounter for other screening for malignant neoplasm of breast: Secondary | ICD-10-CM

## 2014-07-02 ENCOUNTER — Encounter: Payer: Self-pay | Admitting: Family Medicine

## 2014-07-02 ENCOUNTER — Encounter: Payer: Self-pay | Admitting: *Deleted

## 2014-07-09 ENCOUNTER — Encounter: Payer: Self-pay | Admitting: *Deleted

## 2014-08-13 ENCOUNTER — Other Ambulatory Visit: Payer: PPO

## 2014-08-13 DIAGNOSIS — E038 Other specified hypothyroidism: Secondary | ICD-10-CM

## 2014-08-13 LAB — T4, FREE: Free T4: 1.46 ng/dL (ref 0.80–1.80)

## 2014-08-13 LAB — TSH: TSH: 2.561 u[IU]/mL (ref 0.350–4.500)

## 2014-08-13 LAB — T3, FREE: T3, Free: 3 pg/mL (ref 2.3–4.2)

## 2015-01-24 ENCOUNTER — Ambulatory Visit (INDEPENDENT_AMBULATORY_CARE_PROVIDER_SITE_OTHER): Payer: PPO | Admitting: *Deleted

## 2015-01-24 DIAGNOSIS — Z23 Encounter for immunization: Secondary | ICD-10-CM

## 2015-02-08 ENCOUNTER — Telehealth: Payer: Self-pay | Admitting: Family Medicine

## 2015-02-08 DIAGNOSIS — E039 Hypothyroidism, unspecified: Secondary | ICD-10-CM

## 2015-02-08 MED ORDER — LEVOTHYROXINE SODIUM 100 MCG PO TABS: 100.0000 ug | ORAL_TABLET | Freq: Every day | ORAL | Status: DC

## 2015-02-08 NOTE — Telephone Encounter (Signed)
Refill appropriate and filled per protocol.  Call placed to patient and patient made aware.  

## 2015-02-08 NOTE — Telephone Encounter (Signed)
PHARMACY: [facility]   MEDICATION: SYNTHROID   QTY:    SIG:    PHYSICIAN: [place]   PT. PHONE #: [phone]  Pt called and states that the pharmacy faxed a rx request to [place] yesterday, but she is leaving to go out of the country tomorrow and is hoping to have enough to get her through next week.

## 2015-02-12 ENCOUNTER — Other Ambulatory Visit: Payer: Self-pay | Admitting: *Deleted

## 2015-02-12 DIAGNOSIS — E039 Hypothyroidism, unspecified: Secondary | ICD-10-CM

## 2015-02-12 MED ORDER — LEVOTHYROXINE SODIUM 100 MCG PO TABS: 100.0000 ug | ORAL_TABLET | Freq: Every day | ORAL | Status: DC

## 2015-02-12 NOTE — Telephone Encounter (Signed)
Received fax requesting refill on levothyroxine.   Refill appropriate and filled per protocol.

## 2015-05-20 ENCOUNTER — Other Ambulatory Visit: Payer: Self-pay

## 2015-05-20 DIAGNOSIS — Z1231 Encounter for screening mammogram for malignant neoplasm of breast: Secondary | ICD-10-CM

## 2015-06-21 ENCOUNTER — Other Ambulatory Visit: Payer: Self-pay | Admitting: Family Medicine

## 2015-06-21 ENCOUNTER — Other Ambulatory Visit: Payer: PPO

## 2015-06-21 DIAGNOSIS — Z79899 Other long term (current) drug therapy: Secondary | ICD-10-CM

## 2015-06-21 DIAGNOSIS — Z0000 Encounter for general adult medical examination without abnormal findings: Secondary | ICD-10-CM

## 2015-06-21 DIAGNOSIS — M8580 Other specified disorders of bone density and structure, unspecified site: Secondary | ICD-10-CM

## 2015-06-21 DIAGNOSIS — E039 Hypothyroidism, unspecified: Secondary | ICD-10-CM | POA: Diagnosis not present

## 2015-06-21 DIAGNOSIS — E559 Vitamin D deficiency, unspecified: Secondary | ICD-10-CM | POA: Diagnosis not present

## 2015-06-22 LAB — CBC WITH DIFFERENTIAL/PLATELET
BASOS ABS: 0 10*3/uL (ref 0.0–0.1)
BASOS PCT: 0 % (ref 0–1)
EOS ABS: 0.3 10*3/uL (ref 0.0–0.7)
EOS PCT: 4 % (ref 0–5)
HCT: 39.2 % (ref 36.0–46.0)
Hemoglobin: 12.6 g/dL (ref 12.0–15.0)
LYMPHS ABS: 1.8 10*3/uL (ref 0.7–4.0)
Lymphocytes Relative: 27 % (ref 12–46)
MCH: 29 pg (ref 26.0–34.0)
MCHC: 32.1 g/dL (ref 30.0–36.0)
MCV: 90.1 fL (ref 78.0–100.0)
MPV: 9.4 fL (ref 8.6–12.4)
Monocytes Absolute: 0.6 10*3/uL (ref 0.1–1.0)
Monocytes Relative: 9 % (ref 3–12)
Neutro Abs: 4 10*3/uL (ref 1.7–7.7)
Neutrophils Relative %: 60 % (ref 43–77)
PLATELETS: 266 10*3/uL (ref 150–400)
RBC: 4.35 MIL/uL (ref 3.87–5.11)
RDW: 13.3 % (ref 11.5–15.5)
WBC: 6.6 10*3/uL (ref 4.0–10.5)

## 2015-06-22 LAB — COMPLETE METABOLIC PANEL WITH GFR
ALT: 11 U/L (ref 6–29)
AST: 16 U/L (ref 10–35)
Albumin: 4.3 g/dL (ref 3.6–5.1)
Alkaline Phosphatase: 68 U/L (ref 33–130)
BILIRUBIN TOTAL: 0.4 mg/dL (ref 0.2–1.2)
BUN: 13 mg/dL (ref 7–25)
CALCIUM: 9.7 mg/dL (ref 8.6–10.4)
CHLORIDE: 100 mmol/L (ref 98–110)
CO2: 27 mmol/L (ref 20–31)
Creat: 0.66 mg/dL (ref 0.50–0.99)
GFR, Est Non African American: >89 mL/min (ref >=60)
Glucose, Bld: 95 mg/dL (ref 70–99)
Potassium: 4.6 mmol/L (ref 3.5–5.3)
Sodium: 141 mmol/L (ref 135–146)
Total Protein: 7.3 g/dL (ref 6.1–8.1)

## 2015-06-22 LAB — LIPID PANEL
CHOL/HDL RATIO: 3.8 ratio (ref <=5.0)
CHOLESTEROL: 200 mg/dL (ref 125–200)
HDL: 53 mg/dL (ref >=46)
LDL CALC: 119 mg/dL (ref <130)
Triglycerides: 139 mg/dL (ref <150)
VLDL: 28 mg/dL (ref <30)

## 2015-06-22 LAB — VITAMIN D 25 HYDROXY (VIT D DEFICIENCY, FRACTURES): VIT D 25 HYDROXY: 31 ng/mL (ref 30–100)

## 2015-06-22 LAB — TSH: TSH: 3.47 mIU/L

## 2015-06-25 ENCOUNTER — Encounter: Payer: Self-pay | Admitting: Internal Medicine

## 2015-06-25 ENCOUNTER — Ambulatory Visit (INDEPENDENT_AMBULATORY_CARE_PROVIDER_SITE_OTHER): Payer: PPO | Admitting: Family Medicine

## 2015-06-25 ENCOUNTER — Encounter: Payer: Self-pay | Admitting: Family Medicine

## 2015-06-25 VITALS — BP 136/72 | HR 74 | Temp 98.5°F | Resp 12 | Ht 66.0 in | Wt 166.0 lb

## 2015-06-25 DIAGNOSIS — Z1211 Encounter for screening for malignant neoplasm of colon: Secondary | ICD-10-CM

## 2015-06-25 DIAGNOSIS — Z86010 Personal history of colonic polyps: Secondary | ICD-10-CM | POA: Diagnosis not present

## 2015-06-25 DIAGNOSIS — Z23 Encounter for immunization: Secondary | ICD-10-CM

## 2015-06-25 DIAGNOSIS — E038 Other specified hypothyroidism: Secondary | ICD-10-CM

## 2015-06-25 DIAGNOSIS — Z0000 Encounter for general adult medical examination without abnormal findings: Secondary | ICD-10-CM

## 2015-06-25 NOTE — Assessment & Plan Note (Signed)
Levels within normal limits, no changes

## 2015-06-25 NOTE — Progress Notes (Signed)
Patient ID: Paige Rasmussen, female   DOB: [birth year], 65 y.o.   MRN: [id number] Subjective:   Patient presents for Medicare Annual/Subsequent preventive examination.  Patient here for annual wellness exam. She has no particular concerns. Her fasting labs are reviewed at the bedside.  When we expanded about her depression screening she states that she is upset with herself because of the week she is gaining. She feels depressed when she looks at me air. Her weight is up 8 pounds from her physical last year. She admits to eating out a lot and also snacking late at night when she is watching TV. She states her husband won't exercise with her. She found a friend who is willing to walk with her when the weather gets warm. Review Past Medical/Family/Social: per EMR  Risk Factors  Current exercise habits: none Dietary issues discussed: Yes  Cardiac risk factors: None  Depression Screen  (Note: if answer to either of the following is "Yes", a more complete depression screening is indicated)  Over the past two weeks, have you felt down, depressed or hopeless?Yes Over the past two weeks, have you felt little interest or pleasure in doing things? No Have you lost interest or pleasure in daily life? No Do you often feel hopeless? No Do you cry easily over simple problems? No   Activities of Daily Living  In your present state of health, do you have any difficulty performing the following activities?:  Driving? No  Managing money? No  Feeding yourself? No  Getting from bed to chair? No  Climbing a flight of stairs? No  Preparing food and eating?: No  Bathing or showering? No  Getting dressed: No  Getting to the toilet? No  Using the toilet:No  Moving around from place to place: No  In the past year have you fallen or had a near fall?:No  Are you sexually active? No  Do you have more than one partner? No   Hearing Difficulties: No  Do you often ask people to speak up or repeat themselves?  No  Do you experience ringing or noises in your ears? No Do you have difficulty understanding soft or whispered voices? No  Do you feel that you have a problem with memory? No Do you often misplace items? No  Do you feel safe at home? Yes  Cognitive Testing  Alert? Yes Normal Appearance?Yes  Oriented to person? Yes Place? Yes  Time? Yes  Recall of three objects? Yes  Can perform simple calculations? Yes  Displays appropriate judgment?Yes  Can read the correct time from a watch face?Yes   List the Names of Other Physician/Practitioners you currently use:    Indicate any recent Medical Services you may have received from other than [facility] providers in the past year (date may be approximate).   Screening Tests / Date Colonoscopy    - Due                  Zostavax UTD Mammogram UTD Influenza Vaccine UTD Tetanus/tdap UTD   ROS: GEN- denies fatigue, fever, weight loss,weakness, recent illness HEENT- denies eye drainage, change in vision, nasal discharge, CVS- denies chest pain, palpitations RESP- denies SOB, cough, wheeze ABD- denies N/V, change in stools, abd pain GU- denies dysuria, hematuria, dribbling, incontinence MSK- denies joint pain, muscle aches, injury Neuro- denies headache, dizziness, syncope, seizure activity    PHYSICAL: GEN- NAD, alert and oriented x3 HEENT- PERRL, EOMI, non injected sclera, pink conjunctiva, MMM, oropharynx clear Neck- Supple,  no thryomegaly CVS- RRR, soft 1/6 SEM left sternal border RESP-CTAB ABD-NABS,soft,NT,ND Psych- normal affect and mood  EXT- No edema Pulses- Radial, DP- 2+   Assessment:    Annual wellness medicare exam   Plan:    During the course of the visit the patient was educated and counseled about appropriate screening and preventive services including:  Screening mammography  Colorectal cancer screening - to be set up  Pneumovax 23 given  Screen + for depression. More centered around her weight, we brainstormed  ways to change her diet and get her mor active, so she does not stress over her weight.    Patient Instructions (the written plan) was given to the patient.  Medicare Attestation  I have personally reviewed:  The patient's medical and social history  Their use of alcohol, tobacco or illicit drugs  Their current medications and supplements  The patient's functional ability including ADLs,fall risks, home safety risks, cognitive, and hearing and visual impairment  Diet and physical activities  Evidence for depression or mood disorders  The patient's weight, height, BMI, and visual acuity have been recorded in the chart. I have made referrals, counseling, and provided education to the patient based on review of the above and I have provided the patient with a written personalized care plan for preventive services.

## 2015-06-25 NOTE — Patient Instructions (Signed)
Get mammogram Referral to Dr. Henrene Pastor- for colonoscopy  Calcium take 2  Pneumonia shot given  F/U 1 year

## 2015-07-01 ENCOUNTER — Ambulatory Visit
Admission: RE | Admit: 2015-07-01 | Discharge: 2015-07-01 | Disposition: A | Discharge status: Home / Self Care | Payer: PPO | Source: Ambulatory Visit

## 2015-07-01 DIAGNOSIS — Z1231 Encounter for screening mammogram for malignant neoplasm of breast: Secondary | ICD-10-CM | POA: Diagnosis not present

## 2015-08-20 ENCOUNTER — Ambulatory Visit (AMBULATORY_SURGERY_CENTER): Payer: Self-pay

## 2015-08-20 VITALS — Ht 66.0 in | Wt 161.0 lb

## 2015-08-20 DIAGNOSIS — Z86010 Personal history of colonic polyps: Secondary | ICD-10-CM

## 2015-08-20 MED ORDER — NA SULFATE-K SULFATE-MG SULF 17.5-3.13-1.6 GM/177ML PO SOLN: ORAL | Status: DC

## 2015-08-20 NOTE — Progress Notes (Signed)
Per pt, no allergies to soy or egg products.Pt not taking any weight loss meds or using  O2 at home. 

## 2015-08-21 ENCOUNTER — Encounter: Payer: Self-pay | Admitting: Internal Medicine

## 2015-09-03 ENCOUNTER — Ambulatory Visit (AMBULATORY_SURGERY_CENTER): Payer: PPO | Admitting: Internal Medicine

## 2015-09-03 ENCOUNTER — Encounter: Payer: Self-pay | Admitting: Internal Medicine

## 2015-09-03 VITALS — BP 126/71 | HR 71 | Temp 97.8°F | Resp 17 | Ht 66.0 in | Wt 161.0 lb

## 2015-09-03 DIAGNOSIS — E039 Hypothyroidism, unspecified: Secondary | ICD-10-CM | POA: Diagnosis not present

## 2015-09-03 DIAGNOSIS — Z86010 Personal history of colonic polyps: Secondary | ICD-10-CM

## 2015-09-03 MED ORDER — SODIUM CHLORIDE 0.9 % IV SOLN: 500.0000 mL | INTRAVENOUS | Status: DC

## 2015-09-03 NOTE — Op Note (Signed)
[place] Patient Name: Paige Rasmussen Procedure Date: 09/03/2015 9:08 AM MRN: [id number] Endoscopist: Docia Chuck. Henrene Pastor , MD Age: 65 Referring MD:  Date of Birth: 01-19-1951 Gender: Female Procedure:                Colonoscopy Indications:              Surveillance: Personal history of adenomatous                            polyps on last colonoscopy 5 years ago. Index exam                            March 2012 with small tubular adenoma Medicines:                Monitored Anesthesia Care Procedure:                Pre-Anesthesia Assessment:                           - Prior to the procedure, a History and Physical                            was performed, and patient medications and                            allergies were reviewed. The patient's tolerance of                            previous anesthesia was also reviewed. The risks                            and benefits of the procedure and the sedation                            options and risks were discussed with the patient.                            All questions were answered, and informed consent                            was obtained. Prior Anticoagulants: The patient has                            taken no previous anticoagulant or antiplatelet                            agents. ASA Grade Assessment: II - A patient with                            mild systemic disease. After reviewing the risks                            and benefits, the patient was deemed in  satisfactory condition to undergo the procedure.                           After obtaining informed consent, the colonoscope                            was passed under direct vision. Throughout the                            procedure, the patient's blood pressure, pulse, and                            oxygen saturations were monitored continuously. The                            Model CF-HQ190L [id number]) scope was  introduced                            through the anus and advanced to the the cecum,                            identified by appendiceal orifice and ileocecal                            valve. The ileocecal valve, appendiceal orifice,                            and rectum were photographed. The quality of the                            bowel preparation was excellent. The colonoscopy                            was performed without difficulty. The patient                            tolerated the procedure well. The bowel preparation                            used was SUPREP. Scope In: 9:12:30 AM Scope Out: 9:30:35 AM Scope Withdrawal Time: 0 hours 13 minutes 32 seconds  Total Procedure Duration: 0 hours 18 minutes 5 seconds  Findings:                 A few diverticula were found in the sigmoid colon.                           The exam was otherwise without abnormality on                            direct and retroflexion views. Complications:            No immediate complications. Estimated blood loss:  None. Estimated Blood Loss:     Estimated blood loss: none. Impression:               - Diverticulosis in the sigmoid colon.                           - The examination was otherwise normal on direct                            and retroflexion views.                           - No specimens collected. Recommendation:           - Repeat colonoscopy in 10 years for surveillance.                           - Patient has a contact number available for                            emergencies. The signs and symptoms of potential                            delayed complications were discussed with the                            patient. Return to normal activities tomorrow.                            Written discharge instructions were provided to the                            patient.                           - Resume previous diet.                           -  Continue present medications. Docia Chuck. Henrene Pastor, MD 09/03/2015 9:33:54 AM This report has been signed electronically.

## 2015-09-03 NOTE — Progress Notes (Signed)
Report to PACU, RN, vss, BBS= Clear.  

## 2015-09-03 NOTE — Patient Instructions (Signed)
YOU HAD AN ENDOSCOPIC PROCEDURE TODAY AT [facility]:   Refer to the procedure report that was given to you for any specific questions about what was found during the examination.  If the procedure report does not answer your questions, please call your gastroenterologist to clarify.  If you requested that your care partner not be given the details of your procedure findings, then the procedure report has been included in a sealed envelope for you to review at your convenience later.  YOU SHOULD EXPECT: Some feelings of bloating in the abdomen. Passage of more gas than usual.  Walking can help get rid of the air that was put into your GI tract during the procedure and reduce the bloating. If you had a lower endoscopy (such as a colonoscopy or flexible sigmoidoscopy) you may notice spotting of blood in your stool or on the toilet paper. If you underwent a bowel prep for your procedure, you may not have a normal bowel movement for a few days.  Please Note:  You might notice some irritation and congestion in your nose or some drainage.  This is from the oxygen used during your procedure.  There is no need for concern and it should clear up in a day or so.  SYMPTOMS TO REPORT IMMEDIATELY:   Following lower endoscopy (colonoscopy or flexible sigmoidoscopy):  Excessive amounts of blood in the stool  Significant tenderness or worsening of abdominal pains  Swelling of the abdomen that is new, acute  Fever of 100F or higher  der blades  PFor urgent or emergent issues, a gastroenterologist can be reached at any hour by calling [phone].   DIET: Your first meal following the procedure should be a small meal and then it is ok to progress to your normal diet. Heavy or fried foods are harder to digest and may make you feel nauseous or bloated.  Likewise, meals heavy in dairy and vegetables can increase bloating.  Drink plenty of fluids but you should avoid alcoholic beverages for 24  hours.  ACTIVITY:  You should plan to take it easy for the rest of today and you should NOT DRIVE or use heavy machinery until tomorrow (because of the sedation medicines used during the test).    FOLLOW UP: Our staff will call the number listed on your records the next business day following your procedure to check on you and address any questions or concerns that you may have regarding the information given to you following your procedure. If we do not reach you, we will leave a message.  However, if you are feeling well and you are not experiencing any problems, there is no need to return our call.  We will assume that you have returned to your regular daily activities without incident.  If any biopsies were taken you will be contacted by phone or by letter within the next 1-3 weeks.  Please call us at [phone] if you have not heard about the biopsies in 3 weeks.    SIGNATURES/CONFIDENTIALITY: You and/or your care partner have signed paperwork which will be entered into your electronic medical record.  These signatures attest to the fact that that the information above on your After Visit Summary has been reviewed and is understood.  Full responsibility of the confidentiality of this discharge information lies with you and/or your care-partner.  Diverticulosis and high fiber diet information given.

## 2015-09-04 ENCOUNTER — Telehealth: Payer: Self-pay

## 2015-09-04 NOTE — Telephone Encounter (Signed)
  Follow up Call-  Call back number 09/03/2015  Post procedure Call Back phone  # [phone]  Permission to leave phone message Yes    Patient was called for follow up after her procedure on 09/03/2015. No answer at the number given for follow up phone call. A message was left on the answering machine.

## 2015-10-16 ENCOUNTER — Telehealth: Payer: Self-pay | Admitting: Family Medicine

## 2015-10-16 NOTE — Telephone Encounter (Signed)
Received fax from [facility] back Auth# [id number] Treating Provider Scarlette Shorts MD # of Visits 1 Start Date 09/03/15 End Date 03/01/16 CPT 45378 DX Z86.010

## 2016-01-13 ENCOUNTER — Other Ambulatory Visit: Payer: Self-pay | Admitting: *Deleted

## 2016-01-13 MED ORDER — LEVOTHYROXINE SODIUM 100 MCG PO TABS: 100.0000 ug | ORAL_TABLET | Freq: Every day | ORAL | 1 refills | Status: DC

## 2016-01-13 NOTE — Telephone Encounter (Signed)
Received call from patient.   Requested refill on Synthroid.   Refill appropriate and filled per protocol.

## 2016-03-03 ENCOUNTER — Ambulatory Visit (INDEPENDENT_AMBULATORY_CARE_PROVIDER_SITE_OTHER): Payer: PPO | Admitting: Family Medicine

## 2016-03-03 DIAGNOSIS — Z23 Encounter for immunization: Secondary | ICD-10-CM

## 2016-04-20 ENCOUNTER — Encounter: Payer: Self-pay | Admitting: Family Medicine

## 2016-04-20 ENCOUNTER — Ambulatory Visit (INDEPENDENT_AMBULATORY_CARE_PROVIDER_SITE_OTHER): Payer: PPO | Admitting: Family Medicine

## 2016-04-20 VITALS — BP 162/86 | HR 98 | Temp 98.7°F | Resp 16 | Ht 67.0 in | Wt 153.0 lb

## 2016-04-20 DIAGNOSIS — J4521 Mild intermittent asthma with (acute) exacerbation: Secondary | ICD-10-CM | POA: Diagnosis not present

## 2016-04-20 MED ORDER — PREDNISONE 20 MG PO TABS: ORAL_TABLET | ORAL | 0 refills | Status: DC

## 2016-04-20 MED ORDER — BENZONATATE 100 MG PO CAPS: 200.0000 mg | ORAL_CAPSULE | Freq: Three times a day (TID) | ORAL | 0 refills | Status: DC | PRN

## 2016-04-20 MED ORDER — AZITHROMYCIN 250 MG PO TABS: ORAL_TABLET | ORAL | 0 refills | Status: DC

## 2016-04-20 NOTE — Progress Notes (Signed)
   Subjective:    Patient ID: Paige Rasmussen, female    DOB: [birth year], 66 y.o.   MRN: [id number]  HPI Patient reports a cough productive of green and brown sputum for approximately 3 weeks. She reports some wheezing and chest congestion. She reports subjective fevers. She has a 15-pack-year history of smoking although no documented history of emphysema. However on examination today, the patient has diffuse inspiratory wheezing with rhonchorous breath sounds left greater than right posteriorly. She denies any rhinorrhea or sore throat and she denies any head congestion. She denies any pleurisy or hemoptysis Past Medical History:  Diagnosis Date  . 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 Osteopenia   . Thyroid disease    Past Surgical History:  Procedure Laterality Date  . FINGER SURGERY     left ring finger  . TUBAL LIGATION    . WRIST SURGERY     left wrist   Current Outpatient Prescriptions on File Prior to Visit  Medication Sig Dispense Refill  . aspirin 81 MG tablet Take 81 mg by mouth daily.    . Calcium Carbonate-Vitamin D (CALCIUM-VITAMIN D) 500-200 MG-UNIT per tablet Take 2 tablets by mouth. Take 2 pills daily    . glucosamine-chondroitin 500-400 MG tablet Take 1 tablet by mouth daily.     Marland Kitchen levothyroxine (SYNTHROID, LEVOTHROID) 100 MCG tablet Take 1 tablet (100 mcg total) by mouth daily. 90 tablet 1  . vitamin C (ASCORBIC ACID) 500 MG tablet Take 1,000 mg by mouth daily.      No current facility-administered medications on file prior to visit.    Allergies  Allergen Reactions  . Penicillins     REACTION: eyes, lips, and joints swell  . Codeine     REACTION: nausea and vomiting  . Oxycodone Itching   Social History   Social History  . Marital status: Married    Spouse name: N/A  . Number of children: N/A  . Years of education: N/A   Occupational History  . Not on file.   Social History Main Topics  . Smoking status: Former Smoker    Types: Cigarettes    Quit date:  11/30/1982  . Smokeless tobacco: Never Used  . Alcohol use No  . Drug use: No  . Sexual activity: Not on file   Other Topics Concern  . Not on file   Social History Narrative  . No narrative on file      Review of Systems  All other systems reviewed and are negative.      Objective:   Physical Exam  Cardiovascular: Normal rate, regular rhythm and normal heart sounds.   Pulmonary/Chest: No accessory muscle usage. No respiratory distress. She has wheezes in the right upper field, the right lower field, the left upper field and the left lower field. She has rhonchi.  Vitals reviewed.         Assessment & Plan:  Mild intermittent asthmatic bronchitis with acute exacerbation - Plan: azithromycin (ZITHROMAX) 250 MG tablet, predniSONE (DELTASONE) 20 MG tablet, benzonatate (TESSALON) 100 MG capsule  I'll start the patient on a prednisone taper pack to address to bronchospasms I'm hearing on her exam. Uses Z-Pak for bronchitis. Use Tessalon 200 mg every 8 hours as needed for cough. Recheck in one week or sooner if worse. If no better, proceed with a chest x-ray

## 2016-06-17 DIAGNOSIS — H524 Presbyopia: Secondary | ICD-10-CM | POA: Diagnosis not present

## 2016-06-24 ENCOUNTER — Other Ambulatory Visit: Payer: PPO

## 2016-06-24 ENCOUNTER — Other Ambulatory Visit: Payer: Self-pay | Admitting: Family Medicine

## 2016-06-24 DIAGNOSIS — E039 Hypothyroidism, unspecified: Secondary | ICD-10-CM

## 2016-06-24 DIAGNOSIS — Z79899 Other long term (current) drug therapy: Secondary | ICD-10-CM

## 2016-06-24 DIAGNOSIS — Z0000 Encounter for general adult medical examination without abnormal findings: Secondary | ICD-10-CM | POA: Diagnosis not present

## 2016-06-24 LAB — CBC WITH DIFFERENTIAL/PLATELET
BASOS ABS: 0 {cells}/uL (ref 0–200)
Basophils Relative: 0 %
EOS ABS: 270 {cells}/uL (ref 15–500)
EOS PCT: 5 %
HCT: 38.2 % (ref 35.0–45.0)
HEMOGLOBIN: 12.7 g/dL (ref 12.0–15.0)
LYMPHS ABS: 1674 {cells}/uL (ref 850–3900)
Lymphocytes Relative: 31 %
MCH: 29.5 pg (ref 27.0–33.0)
MCHC: 33.2 g/dL (ref 32.0–36.0)
MCV: 88.8 fL (ref 80.0–100.0)
MPV: 9.2 fL (ref 7.5–12.5)
Monocytes Absolute: 648 cells/uL (ref 200–950)
Monocytes Relative: 12 %
Neutro Abs: 2808 cells/uL (ref 1500–7800)
Neutrophils Relative %: 52 %
Platelets: 237 10*3/uL (ref 140–400)
RBC: 4.3 MIL/uL (ref 3.80–5.10)
RDW: 14.3 % (ref 11.0–15.0)
WBC: 5.4 10*3/uL (ref 3.8–10.8)

## 2016-06-24 LAB — COMPLETE METABOLIC PANEL WITH GFR
ALBUMIN: 4.3 g/dL (ref 3.6–5.1)
ALK PHOS: 62 U/L (ref 33–130)
ALT: 16 U/L (ref 6–29)
AST: 18 U/L (ref 10–35)
BUN: 12 mg/dL (ref 7–25)
CO2: 30 mmol/L (ref 20–31)
Calcium: 9.6 mg/dL (ref 8.6–10.4)
Chloride: 102 mmol/L (ref 98–110)
Creat: 0.68 mg/dL (ref 0.50–0.99)
GFR, Est African American: >89 mL/min (ref >=60)
GLUCOSE: 109 mg/dL — AB (ref 70–99)
POTASSIUM: 4.5 mmol/L (ref 3.5–5.3)
SODIUM: 141 mmol/L (ref 135–146)
Total Bilirubin: 0.4 mg/dL (ref 0.2–1.2)
Total Protein: 7.2 g/dL (ref 6.1–8.1)

## 2016-06-24 LAB — LIPID PANEL
CHOLESTEROL: 194 mg/dL (ref <200)
HDL: 57 mg/dL (ref >50)
LDL CALC: 115 mg/dL — AB (ref <100)
Total CHOL/HDL Ratio: 3.4 Ratio (ref <5.0)
Triglycerides: 112 mg/dL (ref <150)
VLDL: 22 mg/dL (ref <30)

## 2016-06-24 LAB — TSH: TSH: 3.72 mIU/L

## 2016-06-29 ENCOUNTER — Encounter: Payer: Self-pay | Admitting: Family Medicine

## 2016-06-29 ENCOUNTER — Ambulatory Visit (INDEPENDENT_AMBULATORY_CARE_PROVIDER_SITE_OTHER): Payer: PPO | Admitting: Family Medicine

## 2016-06-29 VITALS — BP 128/78 | HR 82 | Temp 98.1°F | Resp 14 | Ht 67.0 in | Wt 156.0 lb

## 2016-06-29 DIAGNOSIS — J069 Acute upper respiratory infection, unspecified: Secondary | ICD-10-CM | POA: Diagnosis not present

## 2016-06-29 DIAGNOSIS — Z0000 Encounter for general adult medical examination without abnormal findings: Secondary | ICD-10-CM

## 2016-06-29 DIAGNOSIS — M8589 Other specified disorders of bone density and structure, multiple sites: Secondary | ICD-10-CM | POA: Diagnosis not present

## 2016-06-29 DIAGNOSIS — Z1239 Encounter for other screening for malignant neoplasm of breast: Secondary | ICD-10-CM

## 2016-06-29 DIAGNOSIS — Z1231 Encounter for screening mammogram for malignant neoplasm of breast: Secondary | ICD-10-CM | POA: Diagnosis not present

## 2016-06-29 DIAGNOSIS — M8580 Other specified disorders of bone density and structure, unspecified site: Secondary | ICD-10-CM | POA: Insufficient documentation

## 2016-06-29 DIAGNOSIS — E038 Other specified hypothyroidism: Secondary | ICD-10-CM | POA: Diagnosis not present

## 2016-06-29 DIAGNOSIS — B9789 Other viral agents as the cause of diseases classified elsewhere: Secondary | ICD-10-CM

## 2016-06-29 MED ORDER — BENZONATATE 100 MG PO CAPS: 100.0000 mg | ORAL_CAPSULE | Freq: Three times a day (TID) | ORAL | 0 refills | Status: DC | PRN

## 2016-06-29 MED ORDER — LEVOTHYROXINE SODIUM 100 MCG PO TABS: 100.0000 ug | ORAL_TABLET | Freq: Every day | ORAL | 2 refills | Status: DC

## 2016-06-29 NOTE — Assessment & Plan Note (Signed)
TFT at goal, no change to dose  

## 2016-06-29 NOTE — Progress Notes (Signed)
Subjective:   Patient presents for Medicare Annual/Subsequent preventive examination.     Pt here for CPE   Fastng labs reviewed Osteopenia- due for repeat bone density / on calcium and vitamin D  Hypothyroidism- taking thryoid medication as prescribed She has had a mild cold still has some cough but is nonproductive no shortness of breath no wheezing. Positive sick contact with her grandchildren    Review Past Medical/Family/Social: Per EMR    Risk Factors  Current exercise habits: walks Dietary issues discussed: None- intentionally lost 10 pounds over the past year  Cardiac risk factors: None   Depression Screen  (Note: if answer to either of the following is "Yes", a more complete depression screening is indicated)  Over the past two weeks, have you felt down, depressed or hopeless? No Over the past two weeks, have you felt little interest or pleasure in doing things? No Have you lost interest or pleasure in daily life? No Do you often feel hopeless? No Do you cry easily over simple problems? No   Activities of Daily Living  In your present state of health, do you have any difficulty performing the following activities?:  Driving? No  Managing money? No  Feeding yourself? No  Getting from bed to chair? No  Climbing a flight of stairs? No  Preparing food and eating?: No  Bathing or showering? No  Getting dressed: No  Getting to the toilet? No  Using the toilet:No  Moving around from place to place: No  In the past year have you fallen or had a near fall?:No  Are you sexually active? No  Do you have more than one partner? No   Hearing Difficulties: No  Do you often ask people to speak up or repeat themselves? No  Do you experience ringing or noises in your ears? No Do you have difficulty understanding soft or whispered voices? No  Do you feel that you have a problem with memory? No Do you often misplace items? No  Do you feel safe at home? Yes  Cognitive Testing   Alert? Yes Normal Appearance?Yes  Oriented to person? Yes Place? Yes  Time? Yes  Recall of three objects? Yes  Can perform simple calculations? Yes  Displays appropriate judgment?Yes  Can read the correct time from a watch face?Yes   Screening Tests / Date Colonoscopy   UTD                  Zostavax UTD Mammogram UTD  Tetanus/tdap UTD Pneumonia- UTD  Bone Density 2016 osteopenia   ROS: GEN- denies fatigue, fever, weight loss,weakness, recent illness HEENT- denies eye drainage, change in vision, nasal discharge, CVS- denies chest pain, palpitations RESP- denies SOB, cough, wheeze ABD- denies N/V, change in stools, abd pain GU- denies dysuria, hematuria, dribbling, incontinence MSK- denies joint pain, muscle aches, injury Neuro- denies headache, dizziness, syncope, seizure activity  Physical: GEN- NAD, alert and oriented x3 HEENT- PERRL, EOMI, non injected sclera, pink conjunctiva, MMM, oropharynx clear,nares clear, TM clear bilat, o effusion  Neck- Supple, no thryomegaly CVS- RRR, no murmur RESP-CTAB EXT- No edema Pulses- Radial, DP- 2+     Assessment:    Annual wellness medicare exam   Plan:    During the course of the visit the patient was educated and counseled about appropriate screening and preventive services including:  Screening mammography  Osteopenia- due for repeat bone density  Immunizations UTD  Depression screen NEG  Reviewed fasting labs at bedside,  Recent URI  with residual cough, given tessalon perrles  Diet review for nutrition referral? Yes ____ Not Indicated __x__  Patient Instructions (the written plan) was given to the patient.  Medicare Attestation  I have personally reviewed:  The patient's medical and social history  Their use of alcohol, tobacco or illicit drugs  Their current medications and supplements  The patient's functional ability including ADLs,fall risks, home safety risks, cognitive, and hearing and visual impairment   Diet and physical activities  Evidence for depression or mood disorders  The patient's weight, height, BMI, and visual acuity have been recorded in the chart. I have made referrals, counseling, and provided education to the patient based on review of the above and I have provided the patient with a written personalized care plan for preventive services.

## 2016-06-29 NOTE — Patient Instructions (Signed)
Schedule Mammogram Bone DENSITY  Tessalon perrles F/U 1 year

## 2016-07-24 ENCOUNTER — Ambulatory Visit
Admission: RE | Admit: 2016-07-24 | Discharge: 2016-07-24 | Disposition: A | Discharge status: Home / Self Care | Payer: PPO | Source: Ambulatory Visit | Attending: Family Medicine | Admitting: Family Medicine

## 2016-07-24 DIAGNOSIS — Z1239 Encounter for other screening for malignant neoplasm of breast: Secondary | ICD-10-CM

## 2016-07-24 DIAGNOSIS — M85852 Other specified disorders of bone density and structure, left thigh: Secondary | ICD-10-CM | POA: Diagnosis not present

## 2016-07-24 DIAGNOSIS — Z780 Asymptomatic menopausal state: Secondary | ICD-10-CM | POA: Diagnosis not present

## 2016-07-24 DIAGNOSIS — Z1231 Encounter for screening mammogram for malignant neoplasm of breast: Secondary | ICD-10-CM | POA: Diagnosis not present

## 2016-07-24 DIAGNOSIS — M8589 Other specified disorders of bone density and structure, multiple sites: Secondary | ICD-10-CM

## 2017-02-04 ENCOUNTER — Ambulatory Visit (INDEPENDENT_AMBULATORY_CARE_PROVIDER_SITE_OTHER): Payer: PPO | Admitting: *Deleted

## 2017-02-04 DIAGNOSIS — Z23 Encounter for immunization: Secondary | ICD-10-CM

## 2017-04-20 ENCOUNTER — Other Ambulatory Visit: Payer: Self-pay | Admitting: Family Medicine

## 2017-04-20 DIAGNOSIS — E038 Other specified hypothyroidism: Secondary | ICD-10-CM

## 2017-06-22 DIAGNOSIS — H524 Presbyopia: Secondary | ICD-10-CM | POA: Diagnosis not present

## 2017-06-28 ENCOUNTER — Other Ambulatory Visit: Payer: PPO

## 2017-06-28 DIAGNOSIS — E038 Other specified hypothyroidism: Secondary | ICD-10-CM

## 2017-06-28 DIAGNOSIS — Z0000 Encounter for general adult medical examination without abnormal findings: Secondary | ICD-10-CM

## 2017-06-28 DIAGNOSIS — E559 Vitamin D deficiency, unspecified: Secondary | ICD-10-CM

## 2017-06-28 DIAGNOSIS — Z13220 Encounter for screening for lipoid disorders: Secondary | ICD-10-CM

## 2017-06-29 LAB — CBC WITH DIFFERENTIAL/PLATELET
BASOS PCT: 0.4 %
Basophils Absolute: 20 cells/uL (ref 0–200)
Eosinophils Absolute: 142 cells/uL (ref 15–500)
Eosinophils Relative: 2.9 %
HCT: 38.1 % (ref 35.0–45.0)
Hemoglobin: 12.8 g/dL (ref 11.7–15.5)
Lymphs Abs: 1578 cells/uL (ref 850–3900)
MCH: 29.3 pg (ref 27.0–33.0)
MCHC: 33.6 g/dL (ref 32.0–36.0)
MCV: 87.2 fL (ref 80.0–100.0)
MONOS PCT: 8.2 %
MPV: 9.6 fL (ref 7.5–12.5)
Neutro Abs: 2759 cells/uL (ref 1500–7800)
Neutrophils Relative %: 56.3 %
PLATELETS: 264 10*3/uL (ref 140–400)
RBC: 4.37 10*6/uL (ref 3.80–5.10)
RDW: 12.1 % (ref 11.0–15.0)
TOTAL LYMPHOCYTE: 32.2 %
WBC mixed population: 402 cells/uL (ref 200–950)
WBC: 4.9 10*3/uL (ref 3.8–10.8)

## 2017-06-29 LAB — LIPID PANEL
Cholesterol: 175 mg/dL (ref <200)
HDL: 54 mg/dL (ref >50)
LDL CHOLESTEROL (CALC): 97 mg/dL
Non-HDL Cholesterol (Calc): 121 mg/dL (calc) (ref <130)
TRIGLYCERIDES: 144 mg/dL (ref <150)
Total CHOL/HDL Ratio: 3.2 (calc) (ref <5.0)

## 2017-06-29 LAB — COMPLETE METABOLIC PANEL WITH GFR
AG Ratio: 1.7 (calc) (ref 1.0–2.5)
ALT: 11 U/L (ref 6–29)
AST: 17 U/L (ref 10–35)
Albumin: 4.5 g/dL (ref 3.6–5.1)
Alkaline phosphatase (APISO): 59 U/L (ref 33–130)
BILIRUBIN TOTAL: 0.4 mg/dL (ref 0.2–1.2)
BUN: 9 mg/dL (ref 7–25)
CALCIUM: 9.9 mg/dL (ref 8.6–10.4)
CHLORIDE: 104 mmol/L (ref 98–110)
CO2: 31 mmol/L (ref 20–32)
Creat: 0.71 mg/dL (ref 0.50–0.99)
GFR, Est African American: 101 mL/min/{1.73_m2} (ref >=60)
GFR, Est Non African American: 88 mL/min/{1.73_m2} (ref >=60)
GLUCOSE: 102 mg/dL — AB (ref 65–99)
Globulin: 2.6 g/dL (calc) (ref 1.9–3.7)
Potassium: 4.7 mmol/L (ref 3.5–5.3)
Sodium: 141 mmol/L (ref 135–146)
Total Protein: 7.1 g/dL (ref 6.1–8.1)

## 2017-06-29 LAB — TSH: TSH: 3.41 m[IU]/L (ref 0.40–4.50)

## 2017-06-29 LAB — VITAMIN D 25 HYDROXY (VIT D DEFICIENCY, FRACTURES): Vit D, 25-Hydroxy: 30 ng/mL (ref 30–100)

## 2017-07-02 ENCOUNTER — Encounter: Payer: Self-pay | Admitting: Family Medicine

## 2017-07-02 ENCOUNTER — Other Ambulatory Visit: Payer: Self-pay

## 2017-07-02 ENCOUNTER — Ambulatory Visit (INDEPENDENT_AMBULATORY_CARE_PROVIDER_SITE_OTHER): Payer: PPO | Admitting: Family Medicine

## 2017-07-02 VITALS — BP 122/82 | HR 64 | Temp 98.0°F | Resp 16 | Ht 67.0 in | Wt 153.0 lb

## 2017-07-02 DIAGNOSIS — F4321 Adjustment disorder with depressed mood: Secondary | ICD-10-CM

## 2017-07-02 DIAGNOSIS — E038 Other specified hypothyroidism: Secondary | ICD-10-CM | POA: Diagnosis not present

## 2017-07-02 DIAGNOSIS — Z0000 Encounter for general adult medical examination without abnormal findings: Secondary | ICD-10-CM

## 2017-07-02 MED ORDER — ESCITALOPRAM OXALATE 5 MG PO TABS: 5.0000 mg | ORAL_TABLET | Freq: Every day | ORAL | 3 refills | Status: DC

## 2017-07-02 MED ORDER — LEVOTHYROXINE SODIUM 100 MCG PO TABS: 100.0000 ug | ORAL_TABLET | Freq: Every day | ORAL | 2 refills | Status: DC

## 2017-07-02 MED ORDER — ZOSTER VAC RECOMB ADJUVANTED 50 MCG/0.5ML IM SUSR: 0.5000 mL | Freq: Once | INTRAMUSCULAR | 0 refills | Status: AC

## 2017-07-02 NOTE — Patient Instructions (Addendum)
Try the lexapro 5mg  at bedtime  F/U 6 weeks

## 2017-07-02 NOTE — Progress Notes (Signed)
Subjective:   Patient presents for Medicare Annual/Subsequent preventive examination.   No specific concerns, but tells me her husband has metastatic cancer. She is trying to "hold it " together. Tries not to cry with him around.  Has a little support from family and friends, but tends to stay to herself.  She does find comfort in God.  She is not sleeping well    Review Past Medical/Family/Social: Per emr    Risk Factors  Current exercise habits: walks Dietary issues discussed: none of concern  Cardiac risk factors: none  Depression Screen  (Note: if answer to either of the following is "Yes", a more complete depression screening is indicated)  Over the past two weeks, have you felt down, depressed or hopeless? yes Over the past two weeks, have you felt little interest or pleasure in doing things? No Have you lost interest or pleasure in daily life? No Do you often feel hopeless? No Do you cry easily over simple problems? No   Activities of Daily Living  In your present state of health, do you have any difficulty performing the following activities?:  Driving? No  Managing money? No  Feeding yourself? No  Getting from bed to chair? No  Climbing a flight of stairs? No  Preparing food and eating?: No  Bathing or showering? No  Getting dressed: No  Getting to the toilet? No  Using the toilet:No  Moving around from place to place: No  In the past year have you fallen or had a near fall?:No  Are you sexually active? yes  Do you have more than one partner? No   Hearing Difficulties: No  Do you often ask people to speak up or repeat themselves? No  Do you experience ringing or noises in your ears? No Do you have difficulty understanding soft or whispered voices? No  Do you feel that you have a problem with memory? No Do you often misplace items? No  Do you feel safe at home? Yes  Cognitive Testing  Alert? Yes Normal Appearance?Yes  Oriented to person? Yes Place? Yes   Time? Yes  Recall of three objects? Yes  Can perform simple calculations? Yes  Displays appropriate judgment?Yes  Can read the correct time from a watch face?Yes    Screening Tests / Date others UTD Colonoscopy                     Shingrix- DUE Mammogram  Influenza Vaccine  Tetanus/tdap  ROS: GEN- denies fatigue, fever, weight loss,weakness, recent illness HEENT- denies eye drainage, change in vision, nasal discharge, CVS- denies chest pain, palpitations RESP- denies SOB, cough, wheeze ABD- denies N/V, change in stools, abd pain GU- denies dysuria, hematuria, dribbling, incontinence MSK- denies joint pain, muscle aches, injury Neuro- denies headache, dizziness, syncope, seizure activity  PHYSICAL GEN- NAD, alert and oriented x3 HEENT- PERRL, EOMI, non injected sclera, pink conjunctiva, MMM, oropharynx clear Neck- Supple, no thryomegaly CVS- RRR, no murmur RESP-CTAB ABD-NABS,soft,NT,ND Psych- tearful at times, not anxious, no SI, well groomed  EXT- No edema Pulses- Radial, DP- 2+    Assessment:    Annual wellness medicare exam   Plan:    During the course of the visit the patient was educated and counseled about appropriate screening and preventive services including:  Prevention UTD- Shingles vaccine. Prescription given to that she can get the vaccine at the pharmacy or Medicare part D.  Situational depression with husband with cancer- trial of lexapro 5mg  at bedtime,  advised to call for any concerns or if we can be helpful   F/U 6 weeks   Reviewed her fasting labs, things look good  Thyroid levels are controlled no changes    Diet review for nutrition referral? Yes ____ Not Indicated __x__  Patient Instructions (the written plan) was given to the patient.  Medicare Attestation  I have personally reviewed:  The patient's medical and social history  Their use of alcohol, tobacco or illicit drugs  Their current medications and supplements  The patient's  functional ability including ADLs,fall risks, home safety risks, cognitive, and hearing and visual impairment  Diet and physical activities  Evidence for depression or mood disorders  The patient's weight, height, BMI, and visual acuity have been recorded in the chart. I have made referrals, counseling, and provided education to the patient based on review of the above and I have provided the patient with a written personalized care plan for preventive services.

## 2017-07-04 ENCOUNTER — Encounter: Payer: Self-pay | Admitting: Family Medicine

## 2017-07-07 ENCOUNTER — Other Ambulatory Visit: Payer: Self-pay | Admitting: Family Medicine

## 2017-07-07 DIAGNOSIS — Z1231 Encounter for screening mammogram for malignant neoplasm of breast: Secondary | ICD-10-CM

## 2017-07-29 ENCOUNTER — Ambulatory Visit
Admission: RE | Admit: 2017-07-29 | Discharge: 2017-07-29 | Disposition: A | Discharge status: Home / Self Care | Payer: PPO | Source: Ambulatory Visit | Attending: Family Medicine | Admitting: Family Medicine

## 2017-07-29 DIAGNOSIS — Z1231 Encounter for screening mammogram for malignant neoplasm of breast: Secondary | ICD-10-CM | POA: Diagnosis not present

## 2017-08-13 ENCOUNTER — Ambulatory Visit: Payer: PPO | Admitting: Family Medicine

## 2017-11-23 ENCOUNTER — Encounter: Payer: Self-pay | Admitting: Family Medicine

## 2017-11-23 ENCOUNTER — Other Ambulatory Visit: Payer: Self-pay

## 2017-11-23 ENCOUNTER — Ambulatory Visit (INDEPENDENT_AMBULATORY_CARE_PROVIDER_SITE_OTHER): Payer: PPO | Admitting: Family Medicine

## 2017-11-23 VITALS — BP 138/74 | HR 70 | Temp 98.3°F | Resp 16 | Ht 67.0 in | Wt 149.0 lb

## 2017-11-23 DIAGNOSIS — J209 Acute bronchitis, unspecified: Secondary | ICD-10-CM

## 2017-11-23 MED ORDER — ALBUTEROL SULFATE HFA 108 (90 BASE) MCG/ACT IN AERS: 2.0000 | INHALATION_SPRAY | Freq: Four times a day (QID) | RESPIRATORY_TRACT | 1 refills | Status: DC | PRN

## 2017-11-23 MED ORDER — PREDNISONE 20 MG PO TABS: 40.0000 mg | ORAL_TABLET | Freq: Every day | ORAL | 0 refills | Status: DC

## 2017-11-23 MED ORDER — BENZONATATE 200 MG PO CAPS: 200.0000 mg | ORAL_CAPSULE | Freq: Three times a day (TID) | ORAL | 0 refills | Status: DC | PRN

## 2017-11-23 NOTE — Patient Instructions (Signed)
Take prednisone as prescribed Take albuterol  Tessalon perrles  F/U as needed

## 2017-11-23 NOTE — Progress Notes (Signed)
   Subjective:    Patient ID: Paige Rasmussen, female    DOB: 06-21-1950, 67 y.o.   MRN: [id number]  Patient presents for Cough (x4 weeks- productive cough with thick cloudy mucus)     Pt here with cough with mild production. Had cold a few weeks, No fever. She is blowing a lot , not sure about post nasal drip   Initially had sore throat Has used coricidan for cough  No vomiting or diarrhea   No wheezing or SOB     Review Of Systems: per above  GEN- denies fatigue, fever, weight loss,weakness, recent illness HEENT- denies eye drainage, change in vision, +nasal discharge, CVS- denies chest pain, palpitations RESP- denies SOB, +cough, wheeze ABD- denies N/V, change in stools, abd pain GU- denies dysuria, hematuria, dribbling, incontinence MSK- denies joint pain, muscle aches, injury Neuro- denies headache, dizziness, syncope, seizure activity       Objective:    BP 138/74   Pulse 70   Temp 98.3 F (36.8 C) (Oral)   Resp 16   Ht 5\' 7"  (1.702 m)   Wt 149 lb (67.6 kg)   SpO2 96%   BMI 23.34 kg/m  GEN- NAD, alert and oriented x3 HEENT- PERRL, EOMI, non injected sclera, pink conjunctiva, MMM, oropharynx clear, nares clear rhiorrhea, TM clear bilat, no maxillary sinus tenderness Neck- Supple, no LAD  CVS- RRR, no murmur RESP-scattered expiratory wheeze, good air movement, normal WOB at rest , no rales  EXT- No edema Pulses- Radial 2+        Assessment & Plan:      Problem List Items Addressed This Visit    None    Visit Diagnoses    Acute bronchitis, unspecified organism    -  Primary   Treat with prednisone, albuterol inhaler tessalon, if she still does not improve obtain CXR, but no fever or sign of bacterial superinfectionat this time      Note: This dictation was prepared with Dragon dictation along with smaller phrase technology. Any transcriptional errors that result from this process are unintentional.

## 2017-11-24 ENCOUNTER — Encounter: Payer: Self-pay | Admitting: Family Medicine

## 2017-12-07 ENCOUNTER — Other Ambulatory Visit: Payer: Self-pay

## 2017-12-07 ENCOUNTER — Encounter: Payer: Self-pay | Admitting: Family Medicine

## 2017-12-07 ENCOUNTER — Ambulatory Visit (INDEPENDENT_AMBULATORY_CARE_PROVIDER_SITE_OTHER): Payer: PPO | Admitting: Family Medicine

## 2017-12-07 VITALS — BP 126/82 | HR 80 | Temp 98.2°F | Resp 14 | Ht 67.0 in | Wt 145.0 lb

## 2017-12-07 DIAGNOSIS — R319 Hematuria, unspecified: Secondary | ICD-10-CM | POA: Diagnosis not present

## 2017-12-07 DIAGNOSIS — R300 Dysuria: Secondary | ICD-10-CM | POA: Diagnosis not present

## 2017-12-07 DIAGNOSIS — R310 Gross hematuria: Secondary | ICD-10-CM | POA: Diagnosis not present

## 2017-12-07 DIAGNOSIS — N390 Urinary tract infection, site not specified: Secondary | ICD-10-CM

## 2017-12-07 LAB — URINALYSIS, ROUTINE W REFLEX MICROSCOPIC
Bilirubin Urine: NEGATIVE
GLUCOSE, UA: NEGATIVE
Ketones, ur: NEGATIVE
Nitrite: NEGATIVE
SPECIFIC GRAVITY, URINE: 1.01 (ref 1.001–1.03)
pH: 7 (ref 5.0–8.0)

## 2017-12-07 LAB — MICROSCOPIC MESSAGE

## 2017-12-07 MED ORDER — ONDANSETRON HCL 4 MG PO TABS: 4.0000 mg | ORAL_TABLET | Freq: Three times a day (TID) | ORAL | 0 refills | Status: DC | PRN

## 2017-12-07 MED ORDER — SULFAMETHOXAZOLE-TRIMETHOPRIM 800-160 MG PO TABS: 1.0000 | ORAL_TABLET | Freq: Two times a day (BID) | ORAL | 0 refills | Status: DC

## 2017-12-07 NOTE — Progress Notes (Signed)
Patient ID: Paige Rasmussen, female    DOB: 08/31/1950, 67 y.o.   MRN: [id number]  PCP: Alycia Rossetti, MD  Chief Complaint  Patient presents with  . Dysuria    x2 days- urinary frequency, painful urination, hematuria    Subjective:   Paige Rasmussen is a 67 y.o. female, presents to clinic with CC of 2 days of worsening dysuria with hematuria.   Dysuria   This is a new problem. The current episode started yesterday. The problem occurs every urination. The problem has been rapidly worsening. The quality of the pain is described as aching. The pain is at a severity of 5/10. There has been no fever. She is not sexually active. There is no history of pyelonephritis. Associated symptoms include flank pain, frequency, hematuria, nausea and urgency. Pertinent negatives include no chills, discharge, hesitancy, possible pregnancy, sweats or vomiting. She has tried nothing for the symptoms. There is no history of catheterization, kidney stones, recurrent UTIs, a single kidney, urinary stasis or a urological procedure.     Patient Active Problem List   Diagnosis Date Noted  . Osteopenia 06/29/2016  . Hypothyroidism 11/29/2012     Prior to Admission medications   Medication Sig Start Date End Date Taking? Authorizing Provider  aspirin 81 MG tablet Take 81 mg by mouth daily.   Yes [provider]  Calcium Carbonate-Vitamin D (CALCIUM-VITAMIN D) 500-200 MG-UNIT per tablet Take 2 tablets by mouth. Take 2 pills daily   Yes [provider]  glucosamine-chondroitin 500-400 MG tablet Take 1 tablet by mouth daily.    Yes [provider]  levothyroxine (SYNTHROID, LEVOTHROID) 100 MCG tablet Take 1 tablet (100 mcg total) by mouth daily. 07/02/17  Yes [facility] [place], Modena Nunnery, MD  vitamin C (ASCORBIC ACID) 500 MG tablet Take 1,000 mg by mouth daily.    Yes [provider]     Allergies  Allergen Reactions  . Penicillins     REACTION: eyes, lips, and joints swell    . Codeine     REACTION: nausea and vomiting  . Oxycodone Itching     Family History  Problem Relation Age of Onset  . Multiple myeloma Father   . Diabetes Sister   . Diabetes Brother         Review of Systems  Constitutional: Positive for appetite change. Negative for activity change, chills, diaphoresis, fatigue, fever and unexpected weight change.  HENT: Negative.   Eyes: Negative.   Respiratory: Negative.   Cardiovascular: Negative.   Gastrointestinal: Positive for abdominal pain and nausea. Negative for abdominal distention, blood in stool, constipation, diarrhea and vomiting.  Endocrine: Negative.   Genitourinary: Positive for dysuria, flank pain, frequency, hematuria and urgency. Negative for hesitancy.  Skin: Negative.   Allergic/Immunologic: Negative.  Negative for immunocompromised state.  Neurological: Negative.   Hematological: Negative.   Psychiatric/Behavioral: Negative.   All other systems reviewed and are negative.      Objective:    Vitals:   12/07/17 1536  BP: 126/82  Pulse: 80  Resp: 14  Temp: 98.2 F (36.8 C)  TempSrc: Oral  SpO2: 99%  Weight: 145 lb (65.8 kg)  Height: 5' 7"  (1.702 m)      Physical Exam  Constitutional: She appears well-developed and well-nourished. No distress.  HENT:  Head: Normocephalic and atraumatic.  Nose: Nose normal.  Eyes: Conjunctivae are normal. Right eye exhibits no discharge. Left eye exhibits no discharge.  Neck: No tracheal deviation present.  Cardiovascular: Normal  rate and regular rhythm.  Pulmonary/Chest: Effort normal. No stridor. No respiratory distress.  Abdominal: Soft. Bowel sounds are normal. She exhibits no distension and no mass. There is no tenderness. There is no rebound and no guarding. No hernia.  Mild right CVA tenderness, no left CVA tenderness  Musculoskeletal: Normal range of motion.  Neurological: She is alert. She exhibits normal muscle tone. Coordination normal.  Skin: Skin is  warm and dry. No rash noted. She is not diaphoretic.  Psychiatric: She has a normal mood and affect. Her behavior is normal.  Nursing note and vitals reviewed.   Results for orders placed or performed in visit on 06/28/17  CBC with Differential/Platelet  Result Value Ref Range   WBC 4.9 3.8 - 10.8 Thousand/uL   RBC 4.37 3.80 - 5.10 Million/uL   Hemoglobin 12.8 11.7 - 15.5 g/dL   HCT 38.1 35.0 - 45.0 %   MCV 87.2 80.0 - 100.0 fL   MCH 29.3 27.0 - 33.0 pg   MCHC 33.6 32.0 - 36.0 g/dL   RDW 12.1 11.0 - 15.0 %   Platelets 264 140 - 400 Thousand/uL   MPV 9.6 7.5 - 12.5 fL   Neutro Abs 2,759 1,500 - 7,800 cells/uL   Lymphs Abs 1,578 850 - 3,900 cells/uL   WBC mixed population 402 200 - 950 cells/uL   Eosinophils Absolute 142 15 - 500 cells/uL   Basophils Absolute 20 0 - 200 cells/uL   Neutrophils Relative % 56.3 %   Total Lymphocyte 32.2 %   Monocytes Relative 8.2 %   Eosinophils Relative 2.9 %   Basophils Relative 0.4 %  COMPLETE METABOLIC PANEL WITH GFR  Result Value Ref Range   Glucose, Bld 102 (H) 65 - 99 mg/dL   BUN 9 7 - 25 mg/dL   Creat 0.71 0.50 - 0.99 mg/dL   GFR, Est Non African American 88 > OR = 60 mL/min/1.82m   GFR, Est African American 101 > OR = 60 mL/min/1.768m  BUN/Creatinine Ratio NOT APPLICABLE 6 - 22 (calc)   Sodium 141 135 - 146 mmol/L   Potassium 4.7 3.5 - 5.3 mmol/L   Chloride 104 98 - 110 mmol/L   CO2 31 20 - 32 mmol/L   Calcium 9.9 8.6 - 10.4 mg/dL   Total Protein 7.1 6.1 - 8.1 g/dL   Albumin 4.5 3.6 - 5.1 g/dL   Globulin 2.6 1.9 - 3.7 g/dL (calc)   AG Ratio 1.7 1.0 - 2.5 (calc)   Total Bilirubin 0.4 0.2 - 1.2 mg/dL   Alkaline phosphatase (APISO) 59 33 - 130 U/L   AST 17 10 - 35 U/L   ALT 11 6 - 29 U/L  Lipid panel  Result Value Ref Range   Cholesterol 175 <200 mg/dL   HDL 54 >50 mg/dL   Triglycerides 144 <150 mg/dL   LDL Cholesterol (Calc) 97 mg/dL (calc)   Total CHOL/HDL Ratio 3.2 <5.0 (calc)   Non-HDL Cholesterol (Calc) 121 <130 mg/dL  (calc)  TSH  Result Value Ref Range   TSH 3.41 0.40 - 4.50 mIU/L  VITAMIN D 25 Hydroxy (Vit-D Deficiency, Fractures)  Result Value Ref Range   Vit D, 25-Hydroxy 30 30 - 100 ng/mL         Assessment & Plan:      ICD-10-CM   1. Urinary tract infection with hematuria, site unspecified N39.0 Urinalysis, Routine w reflex microscopic   R31.9 Urine Culture    sulfamethoxazole-trimethoprim (BACTRIM DS,SEPTRA DS) 800-160 MG tablet  ondansetron (ZOFRAN) 4 MG tablet    Microscopic Message    Pt with high penicillin allergy, cannot use keflex for UTI, bactrim to tx UTI (macrobid avoided due to CVA tenderness, cipro - last resort due to SE/resistanc patterns)  zofran for nausea, pt felt a little queezy today, barely ate, no vomiting.  Take abx, push fluids, zofran PRN nausea.  F/up if not improving in 2-3 days ER if unable to take meds or tolerate fluids.   Delsa Grana, PA-C 12/07/17 3:55 PM

## 2017-12-07 NOTE — Patient Instructions (Signed)

## 2017-12-09 LAB — URINE CULTURE
MICRO NUMBER: 91023387
SPECIMEN QUALITY: ADEQUATE

## 2017-12-11 ENCOUNTER — Encounter (HOSPITAL_BASED_OUTPATIENT_CLINIC_OR_DEPARTMENT_OTHER): Payer: Self-pay | Admitting: *Deleted

## 2017-12-11 ENCOUNTER — Emergency Department (HOSPITAL_BASED_OUTPATIENT_CLINIC_OR_DEPARTMENT_OTHER)
Admission: EM | Admit: 2017-12-11 | Discharge: 2017-12-11 | Disposition: A | Discharge status: Home / Self Care | Payer: PPO | Attending: Emergency Medicine | Admitting: Emergency Medicine

## 2017-12-11 ENCOUNTER — Emergency Department (HOSPITAL_BASED_OUTPATIENT_CLINIC_OR_DEPARTMENT_OTHER): Payer: PPO

## 2017-12-11 ENCOUNTER — Other Ambulatory Visit: Payer: Self-pay

## 2017-12-11 DIAGNOSIS — Z7982 Long term (current) use of aspirin: Secondary | ICD-10-CM | POA: Insufficient documentation

## 2017-12-11 DIAGNOSIS — N390 Urinary tract infection, site not specified: Secondary | ICD-10-CM

## 2017-12-11 DIAGNOSIS — N281 Cyst of kidney, acquired: Secondary | ICD-10-CM | POA: Diagnosis not present

## 2017-12-11 DIAGNOSIS — E039 Hypothyroidism, unspecified: Secondary | ICD-10-CM | POA: Insufficient documentation

## 2017-12-11 DIAGNOSIS — N12 Tubulo-interstitial nephritis, not specified as acute or chronic: Secondary | ICD-10-CM | POA: Insufficient documentation

## 2017-12-11 DIAGNOSIS — R319 Hematuria, unspecified: Secondary | ICD-10-CM

## 2017-12-11 DIAGNOSIS — Z79899 Other long term (current) drug therapy: Secondary | ICD-10-CM | POA: Insufficient documentation

## 2017-12-11 DIAGNOSIS — D649 Anemia, unspecified: Secondary | ICD-10-CM | POA: Diagnosis not present

## 2017-12-11 DIAGNOSIS — Z87891 Personal history of nicotine dependence: Secondary | ICD-10-CM | POA: Diagnosis not present

## 2017-12-11 DIAGNOSIS — R509 Fever, unspecified: Secondary | ICD-10-CM | POA: Diagnosis present

## 2017-12-11 LAB — URINALYSIS, MICROSCOPIC (REFLEX)

## 2017-12-11 LAB — BASIC METABOLIC PANEL
Anion gap: 9 (ref 5–15)
BUN: 12 mg/dL (ref 8–23)
CHLORIDE: 98 mmol/L (ref 98–111)
CO2: 26 mmol/L (ref 22–32)
CREATININE: 1.18 mg/dL — AB (ref 0.44–1.00)
Calcium: 8.7 mg/dL — ABNORMAL LOW (ref 8.9–10.3)
GFR calc non Af Amer: 46 mL/min — ABNORMAL LOW (ref >60)
GFR, EST AFRICAN AMERICAN: 54 mL/min — AB (ref >60)
Glucose, Bld: 125 mg/dL — ABNORMAL HIGH (ref 70–99)
POTASSIUM: 4.1 mmol/L (ref 3.5–5.1)
SODIUM: 133 mmol/L — AB (ref 135–145)

## 2017-12-11 LAB — CBC
HCT: 33.1 % — ABNORMAL LOW (ref 36.0–46.0)
Hemoglobin: 10.9 g/dL — ABNORMAL LOW (ref 12.0–15.0)
MCH: 30.1 pg (ref 26.0–34.0)
MCHC: 32.9 g/dL (ref 30.0–36.0)
MCV: 91.4 fL (ref 78.0–100.0)
PLATELETS: 178 10*3/uL (ref 150–400)
RBC: 3.62 MIL/uL — AB (ref 3.87–5.11)
RDW: 13.2 % (ref 11.5–15.5)
WBC: 3.4 10*3/uL — ABNORMAL LOW (ref 4.0–10.5)

## 2017-12-11 LAB — URINALYSIS, ROUTINE W REFLEX MICROSCOPIC
BILIRUBIN URINE: NEGATIVE
Glucose, UA: NEGATIVE mg/dL
KETONES UR: NEGATIVE mg/dL
Leukocytes, UA: NEGATIVE
NITRITE: NEGATIVE
PH: 5.5 (ref 5.0–8.0)
Protein, ur: NEGATIVE mg/dL
Specific Gravity, Urine: 1.015 (ref 1.005–1.030)

## 2017-12-11 MED ORDER — ONDANSETRON HCL 4 MG/2ML IJ SOLN
4.0000 mg | Freq: Once | INTRAMUSCULAR | Status: AC
  Administered 2017-12-11: 4 mg via INTRAVENOUS
  Filled 2017-12-11: qty 2

## 2017-12-11 MED ORDER — CIPROFLOXACIN IN D5W 400 MG/200ML IV SOLN
400.0000 mg | Freq: Two times a day (BID) | INTRAVENOUS | Status: DC
  Administered 2017-12-11: 400 mg via INTRAVENOUS
  Filled 2017-12-11: qty 200

## 2017-12-11 MED ORDER — ONDANSETRON 8 MG PO TBDP: 8.0000 mg | ORAL_TABLET | Freq: Three times a day (TID) | ORAL | 0 refills | Status: DC | PRN

## 2017-12-11 MED ORDER — CIPROFLOXACIN HCL 500 MG PO TABS: 500.0000 mg | ORAL_TABLET | Freq: Two times a day (BID) | ORAL | 0 refills | Status: DC

## 2017-12-11 MED ORDER — ACETAMINOPHEN 325 MG PO TABS
650.0000 mg | ORAL_TABLET | Freq: Once | ORAL | Status: AC
  Administered 2017-12-11: 650 mg via ORAL
  Filled 2017-12-11: qty 2

## 2017-12-11 MED ORDER — SODIUM CHLORIDE 0.9 % IV BOLUS
1000.0000 mL | Freq: Once | INTRAVENOUS | Status: AC
  Administered 2017-12-11: 1000 mL via INTRAVENOUS

## 2017-12-11 MED ORDER — SODIUM CHLORIDE 0.9 % IV SOLN
INTRAVENOUS | Status: DC | PRN
  Administered 2017-12-11: 250 mL via INTRAVENOUS

## 2017-12-11 NOTE — ED Notes (Signed)
ED Provider at bedside. 

## 2017-12-11 NOTE — ED Notes (Signed)
Pt verbalized understanding that prescriptions are e-prescribed and can be picked up as directed on d/c papers

## 2017-12-11 NOTE — ED Notes (Signed)
Pt reports history of bronchitis 2 weeks ago treated with prednisone and inhaler, Dr Tomi Bamberger made aware.

## 2017-12-11 NOTE — ED Triage Notes (Addendum)
Pt c/o DX UTI x 2 days ago , taking abx w/o improvement fever and nausea today

## 2017-12-11 NOTE — Discharge Instructions (Signed)
Take the antibiotics until complete, take the antinausea medication as needed, follow-up with your doctor next week to make sure you are improving, return to the emergency room over the weekend for persistent fevers, worsening symptoms

## 2017-12-11 NOTE — ED Provider Notes (Signed)
[facility] EMERGENCY DEPARTMENT Provider Note   CSN: [id number] Arrival date & time: 12/11/17  6378     History   Chief Complaint Chief Complaint  Patient presents with  . Urinary Tract Infection    HPI Paige Rasmussen is a 67 y.o. female.  HPI Patient presents to the emergency room for evaluation of fevers and malaise.  Patient states she is had symptoms this past week.  She has had urinary frequency and urgency.  She has not had any trouble with fevers though until recently.  She saw her primary care doctor on August 27.  She was diagnosed with a urinary tract infection and was prescribed Bactrim.  Patient has been taking those medications regularly.  This morning she had a fever up to 101.  She was feeling worse so she came to the ED.  She did have an episode of nausea and vomiting this morning.  She denies any back pain or abdominal pain.  No lightheadedness or syncope.  No shaking chills.  Patient denies any history of kidney infections or kidney stones. Past Medical History:  Diagnosis Date  . 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 Osteopenia   . Thyroid disease     Patient Active Problem List   Diagnosis Date Noted  . Osteopenia 06/29/2016  . Hypothyroidism 11/29/2012    Past Surgical History:  Procedure Laterality Date  . BREAST CYST ASPIRATION    . BREAST EXCISIONAL BIOPSY Right    benign  . FINGER SURGERY     left ring finger  . TUBAL LIGATION    . WRIST SURGERY     left wrist     OB History   None      Home Medications    Prior to Admission medications   Medication Sig Start Date End Date Taking? Authorizing Provider  aspirin 81 MG tablet Take 81 mg by mouth daily.    [provider]  Calcium Carbonate-Vitamin D (CALCIUM-VITAMIN D) 500-200 MG-UNIT per tablet Take 2 tablets by mouth. Take 2 pills daily    [provider]  ciprofloxacin (CIPRO) 500 MG tablet Take 1 tablet (500 mg total) by mouth 2 (two) times daily. 12/11/17    Dorie Rank, MD  glucosamine-chondroitin 500-400 MG tablet Take 1 tablet by mouth daily.     [provider]  levothyroxine (SYNTHROID, LEVOTHROID) 100 MCG tablet Take 1 tablet (100 mcg total) by mouth daily. 07/02/17   Alycia Rossetti, MD  ondansetron (ZOFRAN ODT) 8 MG disintegrating tablet Take 1 tablet (8 mg total) by mouth every 8 (eight) hours as needed for nausea or vomiting. 12/11/17   Dorie Rank, MD  sulfamethoxazole-trimethoprim (BACTRIM DS,SEPTRA DS) 800-160 MG tablet Take 1 tablet by mouth 2 (two) times daily. 12/07/17   Delsa Grana, PA-C  vitamin C (ASCORBIC ACID) 500 MG tablet Take 1,000 mg by mouth daily.     [provider]    Family History Family History  Problem Relation Age of Onset  . Multiple myeloma Father   . Diabetes Sister   . Diabetes Brother     Social History Social History   Tobacco Use  . Smoking status: Former Smoker    Types: Cigarettes    Last attempt to quit: 11/30/1982    Years since quitting: 35.0  . Smokeless tobacco: Never Used  Substance Use Topics  . Alcohol use: No    Alcohol/week: 0.0 standard drinks  . Drug use: No     Allergies  Penicillins; Codeine; and Oxycodone   Review of Systems Review of Systems  All other systems reviewed and are negative.    Physical Exam Updated Vital Signs BP (!) 109/50 (BP Location: Left Arm)   Pulse 88   Temp 99.3 F (37.4 C) (Oral)   Resp 16   Ht 1.702 m (_0 )   Wt 65.8 kg   SpO2 95%   BMI 22.71 kg/m   Physical Exam  Constitutional: She appears well-developed and well-nourished. No distress.  HENT:  Head: Normocephalic and atraumatic.  Right Ear: External ear normal.  Left Ear: External ear normal.  Eyes: Conjunctivae are normal. Right eye exhibits no discharge. Left eye exhibits no discharge. No scleral icterus.  Neck: Neck supple. No tracheal deviation present.  Cardiovascular: Normal rate, regular rhythm and intact distal pulses.  Pulmonary/Chest: Effort  normal and breath sounds normal. No stridor. No respiratory distress. She has no wheezes. She has no rales.  Abdominal: Soft. Bowel sounds are normal. She exhibits no distension. There is no tenderness. There is no rebound and no guarding.  Musculoskeletal: She exhibits no edema or tenderness.  Neurological: She is alert. She has normal strength. No cranial nerve deficit (no facial droop, extraocular movements intact, no slurred speech) or sensory deficit. She exhibits normal muscle tone. She displays no seizure activity. Coordination normal.  Skin: Skin is warm and dry. No rash noted.  Psychiatric: She has a normal mood and affect.  Nursing note and vitals reviewed.    ED Treatments / Results  Labs (all labs ordered are listed, but only abnormal results are displayed) Labs Reviewed  URINALYSIS, ROUTINE W REFLEX MICROSCOPIC - Abnormal; Notable for the following components:      Result Value   Hgb urine dipstick MODERATE (*)    All other components within normal limits  CBC - Abnormal; Notable for the following components:   WBC 3.4 (*)    RBC 3.62 (*)    Hemoglobin 10.9 (*)    HCT 33.1 (*)    All other components within normal limits  BASIC METABOLIC PANEL - Abnormal; Notable for the following components:   Sodium 133 (*)    Glucose, Bld 125 (*)    Creatinine, Ser 1.18 (*)    Calcium 8.7 (*)    GFR calc non Af Amer 46 (*)    GFR calc Af Amer 54 (*)    All other components within normal limits  URINALYSIS, MICROSCOPIC (REFLEX) - Abnormal; Notable for the following components:   Bacteria, UA MANY (*)    All other components within normal limits    EKG None  Radiology US Renal  Result Date: 12/11/2017 CLINICAL DATA:  67 year old female with UTI, persistent fever and abnormality in the upper pole of the right kidney seen CT scan of the abdomen and pelvis. EXAM: RENAL / URINARY TRACT ULTRASOUND COMPLETE COMPARISON:  CT scan abdomen/pelvis 12/11/2017 FINDINGS: Right Kidney:  Length: 11.8 cm. Normal parenchymal echogenicity. No evidence of hydronephrosis. Anechoic cystic structure with a thin internal septation present in the upper pole. Overall, the septated cyst measures 1.6 x 1.7 x 1.7 cm. There is posterior acoustic enhancement. No evidence of internal complexity to suggest abscess peer Left Kidney: Length: 12.3 cm. Echogenicity within normal limits. No mass or hydronephrosis visualized. Bladder: Appears normal for degree of bladder distention. Both ureteral jets are visualized. IMPRESSION: Minimally complex (single thin internal septation) 1.7 cm cyst in the region of concern in the upper pole of the right kidney. No sonographic  features to suggest abscess. Electronically Signed   By: Jacqulynn Cadet M.D.   On: 12/11/2017 12:39   Ct Renal Stone Study  Result Date: 12/11/2017 CLINICAL DATA:  68 year old female with recent diagnosis of UTI on antibiotics. Persistent fever. EXAM: CT ABDOMEN AND PELVIS WITHOUT CONTRAST TECHNIQUE: Multidetector CT imaging of the abdomen and pelvis was performed following the standard protocol without IV contrast. COMPARISON:  None. FINDINGS: Lower chest: The lung bases are clear. Visualized cardiac structures are within normal limits for size. No pericardial effusion. Unremarkable visualized distal thoracic esophagus. Hepatobiliary: Normal hepatic contour and morphology. Subcentimeter low-attenuation hepatic lesion in segment 2 is too small for accurate characterization but statistically highly likely a benign cyst. Normal appearance of the gallbladder. No intra or extrahepatic biliary ductal dilatation. Pancreas: Unremarkable. No pancreatic ductal dilatation or surrounding inflammatory changes. Spleen: Normal in size without focal abnormality. Adrenals/Urinary Tract: Adrenal glands are within normal limits. No evidence of hydronephrosis. Punctate nonobstructing stone in the interpolar right kidney. Ill-defined low-attenuation in the posterior  upper pole of the right kidney. The ureters and bladder are unremarkable. Stomach/Bowel: No evidence of obstruction or focal bowel wall thickening. Normal appendix in the right lower quadrant. The terminal ileum is unremarkable. Vascular/Lymphatic: Limited evaluation in the absence of intravenous contrast. Calcifications are present along the abdominal aorta. No aneurysm. No suspicious lymphadenopathy. Reproductive: Uterus and bilateral adnexa are unremarkable. Other: No abdominal wall hernia or abnormality. No abdominopelvic ascites. Musculoskeletal: No acute fracture or aggressive appearing lytic or blastic osseous lesion. Levoconvex scoliosis with associated multifocal degenerative disc disease. IMPRESSION: 1. Ill-defined low-attenuation in the posterior aspect of the upper pole of the right kidney. Given the clinical history of UTI with persistent fever, pyelonephritis or potentially renal abscess are possibilities. Recommend further evaluation with either renal ultrasound, or repeat CT scan of the abdomen with intravenous contrast. 2. No evidence of hydronephrosis. 3. Punctate nephrolithiasis in the interpolar right kidney. 4.  Aortic Atherosclerosis (ICD10-170.0) 5. Levoconvex scoliosis with multilevel degenerative disc disease. Electronically Signed   By: Jacqulynn Cadet M.D.   On: 12/11/2017 11:11    Procedures Procedures (including critical care time)  Medications Ordered in ED Medications  ciprofloxacin (CIPRO) IVPB 400 mg (400 mg Intravenous New Bag/Given 12/11/17 1013)  0.9 %  sodium chloride infusion (250 mLs Intravenous New Bag/Given 12/11/17 1011)  sodium chloride 0.9 % bolus 1,000 mL (0 mLs Intravenous Stopped 12/11/17 1130)  ondansetron (ZOFRAN) injection 4 mg (4 mg Intravenous Given 12/11/17 1002)  acetaminophen (TYLENOL) tablet 650 mg (650 mg Oral Given 12/11/17 1005)     Initial Impression / Assessment and Plan / ED Course  I have reviewed the triage vital signs and the nursing  notes.  Pertinent labs & imaging results that were available during my care of the patient were reviewed by me and considered in my medical decision making (see chart for details).  Clinical Course as of Dec 11 1299  Sat Dec 11, 2017  1033 Anemia is new compared to 5 months ago  CBC(!) [JK]  1033 Creatinine slightly increased compared to previous  Basic metabolic panel(!) [JK]  2353 Urinalysis does show many bacteria and was hemoglobin positive   [JK]  1038 Reviewed findings with pt.  Pt has had some flank pain.  Will ct to rule out ureteral stone, hydronephrosis with her fever, and hematuria.   [JK]    Clinical Course User Index [JK] Dorie Rank, MD    Patient presented to the emergency room for evaluation of fever  and a recent diagnosis of urinary tract infection.  I was able to review her recent notes and lab results.  Patient had a confirmed urinary tract infection with a culture growing E. Coli.  Sensitivities were reviewed .  Her urinalysis still shows many bacteria.  Urine culture was not performed because she just had a urine culture a couple days ago.  Patient's laboratory tests are otherwise reassuring.  She does have mild anemia and renal insufficiency.  Patient's not have any symptoms to suggest bleeding.  I do not think this is related to her symptoms today but I did discuss having her following up with her doctor.  CT scan was performed to make sure she did not have any evidence of infected kidney stone or other acute complication.  Ultrasound was performed because of the concern about possible abscess.  The ultrasound confirmed a cyst but no evidence of abscess.  Patient was treated with IV antibiotics.  She has improved.  She is not having any vomiting or diarrhea.  She is not showing any signs of sepsis.  I discussed continued outpatient treatment with the patient.  She feels comfortable with going home.  Warning signs and precautions were discussed.  Patient be discharged home  with a course of Cipro.  Final Clinical Impressions(s) / ED Diagnoses   Final diagnoses:  Pyelonephritis  Anemia, unspecified type    ED Discharge Orders         Ordered    ciprofloxacin (CIPRO) 500 MG tablet  2 times daily     12/11/17 1256    ondansetron (ZOFRAN ODT) 8 MG disintegrating tablet  Every 8 hours PRN     12/11/17 1256           Dorie Rank, MD 12/11/17 1301

## 2018-01-25 ENCOUNTER — Ambulatory Visit (INDEPENDENT_AMBULATORY_CARE_PROVIDER_SITE_OTHER): Payer: PPO

## 2018-01-25 DIAGNOSIS — Z23 Encounter for immunization: Secondary | ICD-10-CM

## 2018-01-25 NOTE — Progress Notes (Signed)
Patient was in office for high dose flu vaccine. Patient received flu vaccine in her right deltoid. Patient tolerated well

## 2018-02-09 DIAGNOSIS — H43811 Vitreous degeneration, right eye: Secondary | ICD-10-CM | POA: Diagnosis not present

## 2018-02-09 DIAGNOSIS — H25813 Combined forms of age-related cataract, bilateral: Secondary | ICD-10-CM | POA: Diagnosis not present

## 2018-02-09 DIAGNOSIS — H527 Unspecified disorder of refraction: Secondary | ICD-10-CM | POA: Diagnosis not present

## 2018-02-09 DIAGNOSIS — H35372 Puckering of macula, left eye: Secondary | ICD-10-CM | POA: Diagnosis not present

## 2018-02-09 DIAGNOSIS — H02834 Dermatochalasis of left upper eyelid: Secondary | ICD-10-CM | POA: Diagnosis not present

## 2018-02-09 DIAGNOSIS — H02831 Dermatochalasis of right upper eyelid: Secondary | ICD-10-CM | POA: Diagnosis not present

## 2018-03-08 DIAGNOSIS — H25813 Combined forms of age-related cataract, bilateral: Secondary | ICD-10-CM | POA: Diagnosis not present

## 2018-03-15 DIAGNOSIS — H02834 Dermatochalasis of left upper eyelid: Secondary | ICD-10-CM | POA: Diagnosis not present

## 2018-03-15 DIAGNOSIS — Z79899 Other long term (current) drug therapy: Secondary | ICD-10-CM | POA: Diagnosis not present

## 2018-03-15 DIAGNOSIS — H02831 Dermatochalasis of right upper eyelid: Secondary | ICD-10-CM | POA: Diagnosis not present

## 2018-03-15 DIAGNOSIS — H25811 Combined forms of age-related cataract, right eye: Secondary | ICD-10-CM | POA: Diagnosis not present

## 2018-03-15 DIAGNOSIS — Z87891 Personal history of nicotine dependence: Secondary | ICD-10-CM | POA: Diagnosis not present

## 2018-03-15 DIAGNOSIS — Z880 Allergy status to penicillin: Secondary | ICD-10-CM | POA: Diagnosis not present

## 2018-03-15 DIAGNOSIS — H25813 Combined forms of age-related cataract, bilateral: Secondary | ICD-10-CM | POA: Diagnosis not present

## 2018-03-15 DIAGNOSIS — Z885 Allergy status to narcotic agent status: Secondary | ICD-10-CM | POA: Diagnosis not present

## 2018-03-15 DIAGNOSIS — H2181 Floppy iris syndrome: Secondary | ICD-10-CM | POA: Diagnosis not present

## 2018-03-15 DIAGNOSIS — Z7982 Long term (current) use of aspirin: Secondary | ICD-10-CM | POA: Diagnosis not present

## 2018-03-15 DIAGNOSIS — H527 Unspecified disorder of refraction: Secondary | ICD-10-CM | POA: Diagnosis not present

## 2018-04-12 DIAGNOSIS — Z79899 Other long term (current) drug therapy: Secondary | ICD-10-CM | POA: Diagnosis not present

## 2018-04-12 DIAGNOSIS — Z87891 Personal history of nicotine dependence: Secondary | ICD-10-CM | POA: Diagnosis not present

## 2018-04-12 DIAGNOSIS — H35372 Puckering of macula, left eye: Secondary | ICD-10-CM | POA: Diagnosis not present

## 2018-04-12 DIAGNOSIS — H02831 Dermatochalasis of right upper eyelid: Secondary | ICD-10-CM | POA: Diagnosis not present

## 2018-04-12 DIAGNOSIS — H527 Unspecified disorder of refraction: Secondary | ICD-10-CM | POA: Diagnosis not present

## 2018-04-12 DIAGNOSIS — H25812 Combined forms of age-related cataract, left eye: Secondary | ICD-10-CM | POA: Diagnosis not present

## 2018-04-12 DIAGNOSIS — H2512 Age-related nuclear cataract, left eye: Secondary | ICD-10-CM | POA: Diagnosis not present

## 2018-04-12 DIAGNOSIS — H2181 Floppy iris syndrome: Secondary | ICD-10-CM | POA: Diagnosis not present

## 2018-04-12 DIAGNOSIS — H02834 Dermatochalasis of left upper eyelid: Secondary | ICD-10-CM | POA: Diagnosis not present

## 2018-04-12 DIAGNOSIS — Z961 Presence of intraocular lens: Secondary | ICD-10-CM | POA: Diagnosis not present

## 2018-04-12 DIAGNOSIS — Z9841 Cataract extraction status, right eye: Secondary | ICD-10-CM | POA: Diagnosis not present

## 2018-04-12 DIAGNOSIS — E079 Disorder of thyroid, unspecified: Secondary | ICD-10-CM | POA: Diagnosis not present

## 2018-04-12 DIAGNOSIS — H43811 Vitreous degeneration, right eye: Secondary | ICD-10-CM | POA: Diagnosis not present

## 2018-06-23 ENCOUNTER — Other Ambulatory Visit: Payer: Self-pay | Admitting: Family Medicine

## 2018-06-23 DIAGNOSIS — Z1231 Encounter for screening mammogram for malignant neoplasm of breast: Secondary | ICD-10-CM

## 2018-07-22 ENCOUNTER — Other Ambulatory Visit: Payer: PPO

## 2018-07-26 ENCOUNTER — Encounter: Payer: PPO | Admitting: Family Medicine

## 2018-08-02 ENCOUNTER — Ambulatory Visit: Payer: PPO

## 2018-09-14 ENCOUNTER — Ambulatory Visit
Admission: RE | Admit: 2018-09-14 | Discharge: 2018-09-14 | Disposition: A | Discharge status: Home / Self Care | Payer: PPO | Source: Ambulatory Visit | Attending: Family Medicine | Admitting: Family Medicine

## 2018-09-14 ENCOUNTER — Other Ambulatory Visit: Payer: Self-pay

## 2018-09-14 DIAGNOSIS — Z1231 Encounter for screening mammogram for malignant neoplasm of breast: Secondary | ICD-10-CM | POA: Diagnosis not present

## 2018-09-24 ENCOUNTER — Other Ambulatory Visit: Payer: Self-pay | Admitting: Family Medicine

## 2018-09-24 DIAGNOSIS — E038 Other specified hypothyroidism: Secondary | ICD-10-CM

## 2018-10-07 ENCOUNTER — Other Ambulatory Visit: Payer: Self-pay

## 2018-10-07 ENCOUNTER — Other Ambulatory Visit: Payer: PPO

## 2018-10-07 DIAGNOSIS — Z0000 Encounter for general adult medical examination without abnormal findings: Secondary | ICD-10-CM | POA: Diagnosis not present

## 2018-10-08 LAB — CBC WITH DIFFERENTIAL/PLATELET
Absolute Monocytes: 523 cells/uL (ref 200–950)
Basophils Absolute: 28 cells/uL (ref 0–200)
Basophils Relative: 0.5 %
Eosinophils Absolute: 220 cells/uL (ref 15–500)
Eosinophils Relative: 4 %
HCT: 38.7 % (ref 35.0–45.0)
Hemoglobin: 12.5 g/dL (ref 11.7–15.5)
Lymphs Abs: 1634 cells/uL (ref 850–3900)
MCH: 29.7 pg (ref 27.0–33.0)
MCHC: 32.3 g/dL (ref 32.0–36.0)
MCV: 91.9 fL (ref 80.0–100.0)
MPV: 9.6 fL (ref 7.5–12.5)
Monocytes Relative: 9.5 %
Neutro Abs: 3097 cells/uL (ref 1500–7800)
Neutrophils Relative %: 56.3 %
Platelets: 246 10*3/uL (ref 140–400)
RBC: 4.21 10*6/uL (ref 3.80–5.10)
RDW: 12.6 % (ref 11.0–15.0)
Total Lymphocyte: 29.7 %
WBC: 5.5 10*3/uL (ref 3.8–10.8)

## 2018-10-08 LAB — COMPLETE METABOLIC PANEL WITH GFR
AG Ratio: 1.6 (calc) (ref 1.0–2.5)
ALT: 12 U/L (ref 6–29)
AST: 17 U/L (ref 10–35)
Albumin: 4.1 g/dL (ref 3.6–5.1)
Alkaline phosphatase (APISO): 59 U/L (ref 37–153)
BUN: 10 mg/dL (ref 7–25)
CO2: 28 mmol/L (ref 20–32)
Calcium: 9.7 mg/dL (ref 8.6–10.4)
Chloride: 103 mmol/L (ref 98–110)
Creat: 0.71 mg/dL (ref 0.50–0.99)
GFR, Est African American: 101 mL/min/{1.73_m2} (ref >=60)
GFR, Est Non African American: 87 mL/min/{1.73_m2} (ref >=60)
Globulin: 2.6 g/dL (calc) (ref 1.9–3.7)
Glucose, Bld: 98 mg/dL (ref 65–99)
Potassium: 4.6 mmol/L (ref 3.5–5.3)
Sodium: 140 mmol/L (ref 135–146)
Total Bilirubin: 0.4 mg/dL (ref 0.2–1.2)
Total Protein: 6.7 g/dL (ref 6.1–8.1)

## 2018-10-08 LAB — LIPID PANEL
Cholesterol: 182 mg/dL (ref <200)
HDL: 68 mg/dL (ref >=50)
LDL Cholesterol (Calc): 98 mg/dL (calc)
Non-HDL Cholesterol (Calc): 114 mg/dL (calc) (ref <130)
Total CHOL/HDL Ratio: 2.7 (calc) (ref <5.0)
Triglycerides: 70 mg/dL (ref <150)

## 2018-10-08 LAB — TSH: TSH: 3.41 mIU/L (ref 0.40–4.50)

## 2018-10-11 ENCOUNTER — Encounter: Payer: Self-pay | Admitting: Family Medicine

## 2018-10-11 ENCOUNTER — Ambulatory Visit (INDEPENDENT_AMBULATORY_CARE_PROVIDER_SITE_OTHER): Payer: PPO | Admitting: Family Medicine

## 2018-10-11 ENCOUNTER — Other Ambulatory Visit: Payer: Self-pay

## 2018-10-11 VITALS — BP 138/76 | HR 70 | Temp 98.5°F | Resp 14 | Ht 67.0 in | Wt 151.0 lb

## 2018-10-11 DIAGNOSIS — Z0001 Encounter for general adult medical examination with abnormal findings: Secondary | ICD-10-CM | POA: Diagnosis not present

## 2018-10-11 DIAGNOSIS — M8589 Other specified disorders of bone density and structure, multiple sites: Secondary | ICD-10-CM | POA: Diagnosis not present

## 2018-10-11 DIAGNOSIS — E038 Other specified hypothyroidism: Secondary | ICD-10-CM

## 2018-10-11 DIAGNOSIS — Z0000 Encounter for general adult medical examination without abnormal findings: Secondary | ICD-10-CM

## 2018-10-11 MED ORDER — SHINGRIX 50 MCG/0.5ML IM SUSR: 0.5000 mL | Freq: Once | INTRAMUSCULAR | 1 refills | Status: AC

## 2018-10-11 NOTE — Progress Notes (Signed)
Subjective:   Patient presents for Medicare Annual/Subsequent preventive examination.   Doing well no concerns   Had cataract surgery on both eyes in December, no longer as to use glasses   Hypothyroidism taking synthroid  Her husband did pass from cancer  Review Past Medical/Family/Social: Per EMR    Risk Factors  Current exercise habits: walks/works part time Dietary issues discussed: no concerns  Cardiac risk factors: none  Depression Screen  (Note: if answer to either of the following is "Yes", a more complete depression screening is indicated)  Over the past two weeks, have you felt down, depressed or hopeless? No Over the past two weeks, have you felt little interest or pleasure in doing things? No Have you lost interest or pleasure in daily life? No Do you often feel hopeless? No Do you cry easily over simple problems? No   Activities of Daily Living  In your present state of health, do you have any difficulty performing the following activities?:  Driving? No  Managing money? No  Feeding yourself? No  Getting from bed to chair? No  Climbing a flight of stairs? No  Preparing food and eating?: No  Bathing or showering? No  Getting dressed: No  Getting to the toilet? No  Using the toilet:No  Moving around from place to place: No  In the past year have you fallen or had a near fall?:No  Are you sexually active? No  Do you have more than one partner? No   Hearing Difficulties: No  Do you often ask people to speak up or repeat themselves? No  Do you experience ringing or noises in your ears? No Do you have difficulty understanding soft or whispered voices? No  Do you feel that you have a problem with memory? No Do you often misplace items? No  Do you feel safe at home? Yes  Cognitive Testing  Alert? Yes Normal Appearance?Yes  Oriented to person? Yes Place? Yes  Time? Yes  Recall of three objects? Yes  Can perform simple calculations? Yes  Displays  appropriate judgment?Yes  Can read the correct time from a watch face?Yes   List the Names of Other Physician/Practitioners you currently use:  Eye doctor   Screening Tests / Date others UTD Colonoscopy  UTD 2017                    Shingrix- DUE Mammogram Done 2020 Influenza Vaccine  Tetanus/tdap  ROS: GEN- denies fatigue, fever, weight loss,weakness, recent illness HEENT- denies eye drainage, change in vision, nasal discharge, CVS- denies chest pain, palpitations RESP- denies SOB, cough, wheeze ABD- denies N/V, change in stools, abd pain GU- denies dysuria, hematuria, dribbling, incontinence MSK- denies joint pain, muscle aches, injury Neuro- denies headache, dizziness, syncope, seizure activity  PHYSICAL-vitals  GEN- NAD, alert and oriented x3 HEENT- PERRL, EOMI, non injected sclera, pink conjunctiva, MMM, oropharynx clear Neck- Supple, no thryomegaly CVS- RRR, no murmur RESP-CTAB ABD-NABS,soft,NT,ND Psych-Normal affect and mood  EXT- No edema Pulses- Radial, DP- 2+   Assessment:    Annual wellness medicare exam   Plan:    During the course of the visit the patient was educated and counseled about appropriate screening and preventive services including:  Osteopenia- bone density to be done 2021, continue vitamin D   SHe will f/u with dentist    Shingrix- sent to pharmacy    Audit C/ Fall/Depression screen negative    Full Code, Son and daughter are POA      -  doesn't want life extended on life support   Hypothyroidism TSH at goal   Diet review for nutrition referral? Yes ____ Not Indicated __x__  Patient Instructions (the written plan) was given to the patient.  Medicare Attestation  I have personally reviewed:  The patient's medical and social history  Their use of alcohol, tobacco or illicit drugs  Their current medications and supplements  The patient's functional ability including ADLs,fall risks, home safety risks, cognitive, and hearing and  visual impairment  Diet and physical activities  Evidence for depression or mood disorders  The patient's weight, height, BMI, and visual acuity have been recorded in the chart. I have made referrals, counseling, and provided education to the patient based on review of the above and I have provided the patient with a written personalized care plan for preventive services.

## 2018-10-11 NOTE — Patient Instructions (Signed)
F/U 1 year for physical Shingrix sent to pharmacy

## 2018-10-12 ENCOUNTER — Encounter: Payer: Self-pay | Admitting: Family Medicine

## 2018-10-21 ENCOUNTER — Encounter (HOSPITAL_BASED_OUTPATIENT_CLINIC_OR_DEPARTMENT_OTHER): Payer: Self-pay | Admitting: *Deleted

## 2018-10-21 ENCOUNTER — Emergency Department (HOSPITAL_BASED_OUTPATIENT_CLINIC_OR_DEPARTMENT_OTHER)
Admission: EM | Admit: 2018-10-21 | Discharge: 2018-10-21 | Disposition: A | Discharge status: Home / Self Care | Payer: PPO | Attending: Emergency Medicine | Admitting: Emergency Medicine

## 2018-10-21 ENCOUNTER — Emergency Department (HOSPITAL_BASED_OUTPATIENT_CLINIC_OR_DEPARTMENT_OTHER): Payer: PPO

## 2018-10-21 ENCOUNTER — Other Ambulatory Visit: Payer: Self-pay

## 2018-10-21 DIAGNOSIS — S59911A Unspecified injury of right forearm, initial encounter: Secondary | ICD-10-CM | POA: Diagnosis present

## 2018-10-21 DIAGNOSIS — W25XXXA Contact with sharp glass, initial encounter: Secondary | ICD-10-CM | POA: Diagnosis not present

## 2018-10-21 DIAGNOSIS — Z7982 Long term (current) use of aspirin: Secondary | ICD-10-CM | POA: Diagnosis not present

## 2018-10-21 DIAGNOSIS — Y939 Activity, unspecified: Secondary | ICD-10-CM | POA: Insufficient documentation

## 2018-10-21 DIAGNOSIS — S51811A Laceration without foreign body of right forearm, initial encounter: Secondary | ICD-10-CM

## 2018-10-21 DIAGNOSIS — Z87891 Personal history of nicotine dependence: Secondary | ICD-10-CM | POA: Insufficient documentation

## 2018-10-21 DIAGNOSIS — Y929 Unspecified place or not applicable: Secondary | ICD-10-CM | POA: Insufficient documentation

## 2018-10-21 DIAGNOSIS — Y999 Unspecified external cause status: Secondary | ICD-10-CM | POA: Diagnosis not present

## 2018-10-21 DIAGNOSIS — E039 Hypothyroidism, unspecified: Secondary | ICD-10-CM | POA: Diagnosis not present

## 2018-10-21 DIAGNOSIS — Z79899 Other long term (current) drug therapy: Secondary | ICD-10-CM | POA: Diagnosis not present

## 2018-10-21 DIAGNOSIS — S51821A Laceration with foreign body of right forearm, initial encounter: Secondary | ICD-10-CM | POA: Diagnosis not present

## 2018-10-21 MED ORDER — LIDOCAINE-EPINEPHRINE (PF) 2 %-1:200000 IJ SOLN
10.0000 mL | Freq: Once | INTRAMUSCULAR | Status: AC
  Administered 2018-10-21: 10 mL via INTRADERMAL
  Filled 2018-10-21 (×2): qty 10

## 2018-10-21 NOTE — ED Notes (Signed)
Patient is resting comfortably. 

## 2018-10-21 NOTE — ED Notes (Signed)
20 sutures noted from outside of skin.  3 sutures inside of the laceration per patient.

## 2018-10-21 NOTE — ED Triage Notes (Signed)
A glass pitcher fell and broke. Laceration to her right forearm. Bleeding controlled. Neuro intact.

## 2018-10-21 NOTE — ED Notes (Signed)
Pt given ice pack in triage for swelling

## 2018-10-26 NOTE — ED Provider Notes (Signed)
[facility] EMERGENCY DEPARTMENT Provider Note   CSN: [id number] Arrival date & time: 10/21/18  1127     History   Chief Complaint Chief Complaint  Patient presents with  . Laceration    HPI Paige Rasmussen is a 68 y.o. female.  HPI   68 year old female with laceration to right forearm.  Patient was carrying a glass pitcher when she fell and broke.  She cut her arm on a piece of the glass.  No other injuries.  No numbness or tingling.  Tetanus is current.  Past Medical History:  Diagnosis Date  . 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 Osteopenia   . Thyroid disease     Patient Active Problem List   Diagnosis Date Noted  . Osteopenia 06/29/2016  . Hypothyroidism 11/29/2012    Past Surgical History:  Procedure Laterality Date  . BREAST CYST ASPIRATION    . BREAST EXCISIONAL BIOPSY Right    benign  . FINGER SURGERY     left ring finger  . TUBAL LIGATION    . WRIST SURGERY     left wrist     OB History   No obstetric history on file.      Home Medications    Prior to Admission medications   Medication Sig Start Date End Date Taking? Authorizing Provider  aspirin 81 MG tablet Take 81 mg by mouth daily.    [provider]  Calcium Carbonate-Vitamin D (CALCIUM-VITAMIN D) 500-200 MG-UNIT per tablet Take 2 tablets by mouth. Take 2 pills daily    [provider]  glucosamine-chondroitin 500-400 MG tablet Take 1 tablet by mouth daily.     [provider]  levothyroxine (SYNTHROID) 100 MCG tablet TAKE ONE TABLET BY MOUTH EVERY DAY 09/26/18   [place], Modena Nunnery, MD  vitamin C (ASCORBIC ACID) 500 MG tablet Take 1,000 mg by mouth daily.     [provider]    Family History Family History  Problem Relation Age of Onset  . Multiple myeloma Father   . Diabetes Sister   . Diabetes Brother     Social History Social History   Tobacco Use  . Smoking status: Former Smoker    Types: Cigarettes    Quit date: 11/30/1982   Years since quitting: 35.9  . Smokeless tobacco: Never Used  Substance Use Topics  . Alcohol use: No    Alcohol/week: 0.0 standard drinks  . Drug use: No     Allergies   Penicillins, Codeine, and Oxycodone   Review of Systems Review of Systems  All systems reviewed and negative, other than as noted in HPI.  Physical Exam Updated Vital Signs BP 131/79 (BP Location: Left Arm)   Pulse 82   Temp 98.4 F (36.9 C) (Oral)   Resp 12   Ht 5' 7"  (1.702 m)   Wt 67.6 kg   SpO2 99%   BMI 23.34 kg/m   Physical Exam Vitals signs and nursing note reviewed.  Constitutional:      General: She is not in acute distress.    Appearance: She is well-developed.  HENT:     Head: Normocephalic and atraumatic.  Eyes:     General:        Right eye: No discharge.        Left eye: No discharge.     Conjunctiva/sclera: Conjunctivae normal.  Neck:     Musculoskeletal: Neck supple.  Cardiovascular:     Rate and Rhythm: Normal rate and  regular rhythm.     Heart sounds: Normal heart sounds. No murmur. No friction rub. No gallop.   Pulmonary:     Effort: Pulmonary effort is normal. No respiratory distress.     Breath sounds: Normal breath sounds.  Abdominal:     General: There is no distension.     Palpations: Abdomen is soft.     Tenderness: There is no abdominal tenderness.  Musculoskeletal:       Arms:     Comments: Linear laceration to the volar aspect of the mid to distal right forearm. 7cm.  It does extend into the subcutaneous tissue and there is a flexor tendon visible.  It appears to be intact throughout the range of motion she is able to flex against resistance at the wrist and in all digits.  Skin:    General: Skin is warm and dry.  Neurological:     Mental Status: She is alert.  Psychiatric:        Behavior: Behavior normal.        Thought Content: Thought content normal.      ED Treatments / Results  Labs (all labs ordered are listed, but only abnormal results are  displayed) Labs Reviewed - No data to display  EKG None  Radiology No results found.  Procedures Procedures (including critical care time)  LACERATION REPAIR Performed by: Virgel Manifold Authorized by: Virgel Manifold Consent: Verbal consent obtained. Risks and benefits: risks, benefits and alternatives were discussed Consent given by: patient Patient identity confirmed: provided demographic data Prepped and Draped in normal sterile fashion Wound explored  Laceration Location: r volar forearm  Laceration Length: 7 cm  No Foreign Bodies seen or palpated  Anesthesia: local infiltration  Local anesthetic: lidocaine 2% w/ epinephrine  Anesthetic total: 4 ml  Irrigation method: syringe Amount of cleaning: standard  Closure: three deep interrupted to approximate subq tissues with 3-0 vicryl. Skin closed in running fashion with 3-0 prolene.   Patient tolerance: Patient tolerated the procedure well with no immediate complications.   Medications Ordered in ED Medications  lidocaine-EPINEPHrine (XYLOCAINE W/EPI) 2 %-1:200000 (PF) injection 10 mL (10 mLs Intradermal Given by Other 10/21/18 1418)     Initial Impression / Assessment and Plan / ED Course  I have reviewed the triage vital signs and the nursing notes.  Pertinent labs & imaging results that were available during my care of the patient were reviewed by me and considered in my medical decision making (see chart for details).     68 year old female with laceration to right forearm.  Repaired.  Neurovascular intact.  Reports tetanus is current.  Continued wound care and return precautions discussed.  Final Clinical Impressions(s) / ED Diagnoses   Final diagnoses:  Laceration of right forearm, initial encounter    ED Discharge Orders    None       Virgel Manifold, MD 10/26/18 1031

## 2018-11-03 ENCOUNTER — Other Ambulatory Visit: Payer: Self-pay

## 2018-11-04 ENCOUNTER — Encounter: Payer: Self-pay | Admitting: Family Medicine

## 2018-11-04 ENCOUNTER — Ambulatory Visit (INDEPENDENT_AMBULATORY_CARE_PROVIDER_SITE_OTHER): Payer: PPO | Admitting: Family Medicine

## 2018-11-04 VITALS — BP 132/62 | HR 64 | Temp 98.4°F | Resp 16 | Ht 67.0 in | Wt 149.0 lb

## 2018-11-04 DIAGNOSIS — Z4802 Encounter for removal of sutures: Secondary | ICD-10-CM

## 2018-11-04 DIAGNOSIS — S56521D Laceration of other extensor muscle, fascia and tendon at forearm level, right arm, subsequent encounter: Secondary | ICD-10-CM

## 2018-11-04 NOTE — Patient Instructions (Signed)
F/u as needed

## 2018-11-04 NOTE — Progress Notes (Signed)
   Subjective:    Patient ID: Paige Rasmussen, female    DOB: 03-Nov-1950, 68 y.o.   MRN: [id number]  Patient presents for Suture removal (dropped glass pitcher and cut inner R forearm- 23 stitches internal and external- 1 continous (baseball) stitch to external arm)  Patient here for suture removal.  She sustained a laceration to her right forearm after she was carrying a glass picture and it broke.  Her tetanus booster is up-to-date.  She was sutured in the ER on 117 No antibiotics were needed. She was able to move her hand and wrist normally no concerns today  Review Of Systems:  GEN- denies fatigue, fever, weight loss,weakness, recent illness MSK- denies joint pain, muscle aches, injury       Objective:    BP 132/62   Pulse 64   Temp 98.4 F (36.9 C) (Oral)   Resp 16   Ht 5\' 7"  (1.702 m)   Wt 149 lb (67.6 kg)   SpO2 98%   BMI 23.34 kg/m  GEN- NAD, alert and oriented x3 Skin Right forearm- linear laceraetion with suture in place, no eythema, clean  MSK -FROM of wrist arm       Assessment & Plan:      Problem List Items Addressed This Visit    None    Visit Diagnoses    Laceration of extensor tendon of right forearm, subsequent encounter    -  Primary   Sutures removed, steri strips applied, given kerlix/coban can wrap while cleaning/cooking at [place] until completely healed    Visit for suture removal          Note: This dictation was prepared with Dragon dictation along with smaller Company secretary. Any transcriptional errors that result from this process are unintentional.

## 2018-12-07 ENCOUNTER — Ambulatory Visit (INDEPENDENT_AMBULATORY_CARE_PROVIDER_SITE_OTHER): Payer: PPO | Admitting: Family Medicine

## 2018-12-07 ENCOUNTER — Other Ambulatory Visit: Payer: Self-pay

## 2018-12-07 DIAGNOSIS — Z23 Encounter for immunization: Secondary | ICD-10-CM | POA: Diagnosis not present

## 2019-05-31 ENCOUNTER — Encounter: Payer: Self-pay | Admitting: Family Medicine

## 2019-05-31 ENCOUNTER — Telehealth: Payer: Self-pay

## 2019-05-31 ENCOUNTER — Ambulatory Visit: Payer: PPO

## 2019-05-31 ENCOUNTER — Other Ambulatory Visit: Payer: Self-pay

## 2019-05-31 ENCOUNTER — Ambulatory Visit (INDEPENDENT_AMBULATORY_CARE_PROVIDER_SITE_OTHER): Payer: PPO | Admitting: Family Medicine

## 2019-05-31 VITALS — BP 132/68 | HR 82 | Temp 97.6°F | Resp 14 | Ht 67.0 in | Wt 149.0 lb

## 2019-05-31 DIAGNOSIS — R300 Dysuria: Secondary | ICD-10-CM | POA: Diagnosis not present

## 2019-05-31 DIAGNOSIS — N390 Urinary tract infection, site not specified: Secondary | ICD-10-CM | POA: Diagnosis not present

## 2019-05-31 LAB — URINALYSIS, ROUTINE W REFLEX MICROSCOPIC
Bilirubin Urine: NEGATIVE
Glucose, UA: NEGATIVE
Hgb urine dipstick: NEGATIVE
Hyaline Cast: NONE SEEN /LPF
Ketones, ur: NEGATIVE
Leukocytes,Ua: NEGATIVE
Nitrite: POSITIVE — AB
Protein, ur: NEGATIVE
Specific Gravity, Urine: 1.02 (ref 1.001–1.03)
WBC, UA: NONE SEEN /HPF (ref 0–5)
pH: 7 (ref 5.0–8.0)

## 2019-05-31 LAB — MICROSCOPIC MESSAGE

## 2019-05-31 MED ORDER — CIPROFLOXACIN HCL 500 MG PO TABS: 500.0000 mg | ORAL_TABLET | Freq: Two times a day (BID) | ORAL | 0 refills | Status: DC

## 2019-05-31 NOTE — Telephone Encounter (Signed)
Please put her on the schedule at 3:30pm

## 2019-05-31 NOTE — Telephone Encounter (Signed)
Noted  

## 2019-05-31 NOTE — Telephone Encounter (Signed)
Pt notified. Pt will be here by 4:30 to leave urine sample.

## 2019-05-31 NOTE — Telephone Encounter (Signed)
Pt needs appt, we have openings this afternoon Needs to leave urine sample

## 2019-05-31 NOTE — Patient Instructions (Addendum)
Take the Cipro as prescribed F/U as needed

## 2019-05-31 NOTE — Progress Notes (Signed)
   Subjective:    Patient ID: Paige Rasmussen, female    DOB: [birth year], 69 y.o.   MRN: [id number]  Patient presents for Dysuria (x2 days- light burning with urination- has taken AZO)   Pt here with burning with urination for the past 2 days, took AZO but that did not help very much ,she does have history of pyelonephritis   No fever, Vomiting  No diarrhea  No abd pain or cramping, no back pain  She Did have a fall a week or so ago she tripped in her house and hit the end table.  She has a black eye on the left side states minimal pain now and bruising is starting to resolve.  No difficulty breathing no headache no vomiting she did not lose consciousness.  She had COVID-19 vaccine 2 weeks     Review Of Systems:  GEN- denies fatigue, fever, weight loss,weakness, recent illness HEENT- denies eye drainage, change in vision, nasal discharge, CVS- denies chest pain, palpitations RESP- denies SOB, cough, wheeze ABD- denies N/V, change in stools, abd pain GU- + dysuria, denies hematuria, dribbling, incontinence MSK- denies joint pain, muscle aches, injury Neuro- denies headache, dizziness, syncope, seizure activity       Objective:    BP 132/68   Pulse 82   Temp 97.6 F (36.4 C) (Temporal)   Resp 14   Ht 5\' 7"  (1.702 m)   Wt 149 lb (67.6 kg)   SpO2 97%   BMI 23.34 kg/m  GEN- NAD, alert and oriented x3 HEENT- PERRL, EOMI, non injected sclera, pink conjunctiva, Bruising with ecchymosis beneath left eye, mild TTP, mild swelling, CVS- RRR, no murmur RESP-CTAB ABD-NABS,soft,NT,ND,no CVA  EXT- No edema Pulses- Radial  2+        Assessment & Plan:      Problem List Items Addressed This Visit    None    Visit Diagnoses    Urinary tract infection without hematuria, site unspecified    -  Primary   Treat with Cipro for UTI, culture sent, continue to Ice face,hold on imaging at this time, no HA, no nasal symptoms, no vision issues   Relevant Orders   Urinalysis,  Routine w reflex microscopic (Completed)   Urine Culture      Note: This dictation was prepared with Dragon dictation along with smaller phrase technology. Any transcriptional errors that result from this process are unintentional.

## 2019-05-31 NOTE — Telephone Encounter (Signed)
Pt called to report that she feels like she has a uti and wanted to know if you can send her in an antibiotic. Pt's symptoms are burning during urination, polyuria, and discomfort. Please advise.

## 2019-06-02 LAB — URINE CULTURE
MICRO NUMBER:: 10159693
SPECIMEN QUALITY:: ADEQUATE

## 2019-06-24 ENCOUNTER — Other Ambulatory Visit: Payer: Self-pay | Admitting: Family Medicine

## 2019-06-24 DIAGNOSIS — E038 Other specified hypothyroidism: Secondary | ICD-10-CM

## 2019-08-03 ENCOUNTER — Other Ambulatory Visit: Payer: Self-pay

## 2019-08-03 ENCOUNTER — Encounter: Payer: Self-pay | Admitting: Nurse Practitioner

## 2019-08-03 ENCOUNTER — Ambulatory Visit (INDEPENDENT_AMBULATORY_CARE_PROVIDER_SITE_OTHER): Payer: PPO | Admitting: Nurse Practitioner

## 2019-08-03 VITALS — BP 120/80 | HR 89 | Temp 98.8°F | Resp 18 | Wt 144.4 lb

## 2019-08-03 DIAGNOSIS — N898 Other specified noninflammatory disorders of vagina: Secondary | ICD-10-CM | POA: Diagnosis not present

## 2019-08-03 DIAGNOSIS — R319 Hematuria, unspecified: Secondary | ICD-10-CM

## 2019-08-03 DIAGNOSIS — N3289 Other specified disorders of bladder: Secondary | ICD-10-CM | POA: Diagnosis not present

## 2019-08-03 DIAGNOSIS — R102 Pelvic and perineal pain: Secondary | ICD-10-CM | POA: Diagnosis not present

## 2019-08-03 DIAGNOSIS — R399 Unspecified symptoms and signs involving the genitourinary system: Secondary | ICD-10-CM

## 2019-08-03 LAB — URINALYSIS, ROUTINE W REFLEX MICROSCOPIC
Bacteria, UA: NONE SEEN /HPF
Bilirubin Urine: NEGATIVE
Glucose, UA: NEGATIVE
Hyaline Cast: NONE SEEN /LPF
Ketones, ur: NEGATIVE
Leukocytes,Ua: NEGATIVE
Nitrite: NEGATIVE
Protein, ur: NEGATIVE
Specific Gravity, Urine: 1.02 (ref 1.001–1.03)
WBC, UA: NONE SEEN /HPF (ref 0–5)
pH: 6 (ref 5.0–8.0)

## 2019-08-03 LAB — MICROSCOPIC MESSAGE

## 2019-08-03 MED ORDER — NITROFURANTOIN MONOHYD MACRO 100 MG PO CAPS: 100.0000 mg | ORAL_CAPSULE | Freq: Two times a day (BID) | ORAL | 0 refills | Status: AC

## 2019-08-03 MED ORDER — LUVENA VAGINAL MOISTURIZER VA GEL: 1.0000 "application " | Freq: Every day | VAGINAL | 3 refills | Status: DC

## 2019-08-03 NOTE — Patient Instructions (Addendum)
Antibiotic sent to pharmacy; start and complete Drink plenty of water Continue taking Azo for a few more days Sent RX for vaginal dryness treatment follow up with regular PCP if you consider hormonal therapy.

## 2019-08-03 NOTE — Progress Notes (Signed)
 Established Patient Office Visit  Subjective:  Patient ID: Paige Rasmussen, female    DOB: 06/22/1950  Age: 68 y.o. MRN: [id number]  CC:  Chief Complaint  Patient presents with  . Urinary Tract Infection    pressure, burning, more frequently, painful, azo was taken, started 04/18    HPI Paige Rasmussen is a 68 year old female presenting for uti sxs. The sxs are pelvic pressure, urinary burning, bladder spasm. The sxs started yesterday and she started taking AZO OTC which has relief sxs minimally not completely. No fever/chills, other gu/gi sxs, other pain, sob, edema, palpitations, or falls. She has begun to have intercourse with her boyfriend and experiencing vaginal dryness which she has tried KY jelly. She would like alternative to hormone treatment at this time.   Past Medical History:  Diagnosis Date  . 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 Osteopenia   . Thyroid disease     Past Surgical History:  Procedure Laterality Date  . BREAST CYST ASPIRATION    . BREAST EXCISIONAL BIOPSY Right    benign  . FINGER SURGERY     left ring finger  . TUBAL LIGATION    . WRIST SURGERY     left wrist    Family History  Problem Relation Age of Onset  . Multiple myeloma Father   . Diabetes Sister   . Diabetes Brother     Social History   Socioeconomic History  . Marital status: Widowed    Spouse name: Not on file  . Number of children: Not on file  . Years of education: Not on file  . Highest education level: Not on file  Occupational History  . Not on file  Tobacco Use  . Smoking status: Former Smoker    Types: Cigarettes    Quit date: 11/30/1982    Years since quitting: 36.6  . Smokeless tobacco: Never Used  Substance and Sexual Activity  . Alcohol use: No    Alcohol/week: 0.0 standard drinks  . Drug use: No  . Sexual activity: Not on file  Other Topics Concern  . Not on file  Social History Narrative  . Not on file   Social Determinants of Health    Financial Resource Strain:   . Difficulty of Paying Living Expenses:   Food Insecurity:   . Worried About Running Out of Food in the Last Year:   . Ran Out of Food in the Last Year:   Transportation Needs:   . Lack of Transportation (Medical):   . Lack of Transportation (Non-Medical):   Physical Activity:   . Days of Exercise per Week:   . Minutes of Exercise per Session:   Stress:   . Feeling of Stress :   Social Connections:   . Frequency of Communication with Friends and Family:   . Frequency of Social Gatherings with Friends and Family:   . Attends Religious Services:   . Active Member of Clubs or Organizations:   . Attends Club or Organization Meetings:   . Marital Status:   Intimate Partner Violence:   . Fear of Current or Ex-Partner:   . Emotionally Abused:   . Physically Abused:   . Sexually Abused:     Outpatient Medications Prior to Visit  Medication Sig Dispense Refill  . Calcium Carbonate-Vitamin D (CALCIUM-VITAMIN D) 500-200 MG-UNIT per tablet Take 2 tablets by mouth. Take 2 pills daily    . glucosamine-chondroitin 500-400 MG tablet Take 1 tablet by mouth   daily.     . levothyroxine (SYNTHROID) 100 MCG tablet TAKE 1 TABLET BY MOUTH EVERY DAY 90 tablet 2  . vitamin C (ASCORBIC ACID) 500 MG tablet Take 1,000 mg by mouth daily.     . aspirin 81 MG tablet Take 81 mg by mouth daily.    . ciprofloxacin (CIPRO) 500 MG tablet Take 1 tablet (500 mg total) by mouth 2 (two) times daily. 14 tablet 0   No facility-administered medications prior to visit.    Allergies  Allergen Reactions  . Penicillins     REACTION: eyes, lips, and joints swell  . Codeine     REACTION: nausea and vomiting  . Oxycodone Itching    ROS Review of Systems  All other systems reviewed and are negative.     Objective:    Physical Exam  Constitutional: She is oriented to person, place, and time. She appears well-developed and well-nourished.  HENT:  Head: Normocephalic.  Right  Ear: Ear canal normal.  Left Ear: Ear canal normal.  Eyes: Pupils are equal, round, and reactive to light. EOM and lids are normal. Lids are everted and swept, no foreign bodies found.  Neck: Phonation normal. No JVD present.  Cardiovascular: Normal rate, S1 normal, S2 normal and normal pulses.  Pulmonary/Chest: Effort normal.  Abdominal: Soft. Normal appearance and normal aorta. There is no hepatosplenomegaly. There is no abdominal tenderness. There is no CVA tenderness.  Musculoskeletal:        General: Normal range of motion.     Cervical back: Normal range of motion and neck supple.  Lymphadenopathy:    She has no cervical adenopathy.  Neurological: She is alert and oriented to person, place, and time. She has normal strength. She displays a negative Romberg sign. Coordination and gait normal.  Skin: Skin is warm, dry and intact.  Psychiatric: She has a normal mood and affect. Her speech is normal and behavior is normal. Judgment and thought content normal. Cognition and memory are normal.  Nursing note and vitals reviewed.   BP 120/80 (BP Location: Left Arm, Patient Position: Sitting, Cuff Size: Normal)   Pulse 89   Temp 98.8 F (37.1 C) (Temporal)   Resp 18   Wt 144 lb 6.4 oz (65.5 kg)   SpO2 97%   BMI 22.62 kg/m  Wt Readings from Last 3 Encounters:  08/03/19 144 lb 6.4 oz (65.5 kg)  05/31/19 149 lb (67.6 kg)  11/04/18 149 lb (67.6 kg)     Health Maintenance Due  Topic Date Due  . COVID-19 Vaccine (1) Never done    There are no preventive care reminders to display for this patient.  Lab Results  Component Value Date   TSH 3.41 10/07/2018   Lab Results  Component Value Date   WBC 5.5 10/07/2018   HGB 12.5 10/07/2018   HCT 38.7 10/07/2018   MCV 91.9 10/07/2018   PLT 246 10/07/2018   Lab Results  Component Value Date   NA 140 10/07/2018   K 4.6 10/07/2018   CO2 28 10/07/2018   GLUCOSE 98 10/07/2018   BUN 10 10/07/2018   CREATININE 0.71 10/07/2018    BILITOT 0.4 10/07/2018   ALKPHOS 62 06/24/2016   AST 17 10/07/2018   ALT 12 10/07/2018   PROT 6.7 10/07/2018   ALBUMIN 4.3 06/24/2016   CALCIUM 9.7 10/07/2018   ANIONGAP 9 12/11/2017   Lab Results  Component Value Date   CHOL 182 10/07/2018   Lab Results  Component Value Date     HDL 68 10/07/2018   Lab Results  Component Value Date   LDLCALC 98 10/07/2018   Lab Results  Component Value Date   TRIG 70 10/07/2018   Lab Results  Component Value Date   CHOLHDL 2.7 10/07/2018   No results found for: HGBA1C    Assessment & Plan:  Antibiotic sent to pharmacy; start and complete Drink plenty of water Continue taking Azo for a few more days Sent RX for vaginal dryness treatment follow up with regular PCP if you consider hormonal therapy.  Problem List Items Addressed This Visit    None    Visit Diagnoses    UTI symptoms    -  Primary   Relevant Medications   nitrofurantoin, macrocrystal-monohydrate, (MACROBID) 100 MG capsule   Other Relevant Orders   Urinalysis, Routine w reflex microscopic (Completed)   Hematuria, unspecified type       Relevant Medications   nitrofurantoin, macrocrystal-monohydrate, (MACROBID) 100 MG capsule   Pelvic pressure in female       Relevant Medications   nitrofurantoin, macrocrystal-monohydrate, (MACROBID) 100 MG capsule   Spasm of bladder       Relevant Medications   nitrofurantoin, macrocrystal-monohydrate, (MACROBID) 100 MG capsule   Vaginal dryness       Relevant Medications   nitrofurantoin, macrocrystal-monohydrate, (MACROBID) 100 MG capsule      Meds ordered this encounter  Medications  . nitrofurantoin, macrocrystal-monohydrate, (MACROBID) 100 MG capsule    Sig: Take 1 capsule (100 mg total) by mouth 2 (two) times daily for 7 days.    Dispense:  14 capsule    Refill:  0  . Luvena Vaginal Moisturizer GEL    Sig: Place 1 application vaginally daily.    Dispense:  30 g    Refill:  3    Follow-up: Return if symptoms  worsen or fail to improve.    Crystal A Bates, FNP 

## 2019-09-02 IMAGING — CT CT RENAL STONE PROTOCOL
2 of 4 series · 16 of 46 positions shown, 18 images · non-contrast
Comparison: None.

CLINICAL DATA: 66-year-old female with recent diagnosis of UTI on
antibiotics. Persistent fever.

EXAM:
CT ABDOMEN AND PELVIS WITHOUT CONTRAST
TECHNIQUE: Multidetector CT imaging of the abdomen and pelvis was performed
following the standard protocol without IV contrast.

[Series 2: axial st · axial · 0.69mm/px · z∈[-486,-71]mm · 13 of 91 slices shown, 15 images]
[im 4/91  soft-tissue]
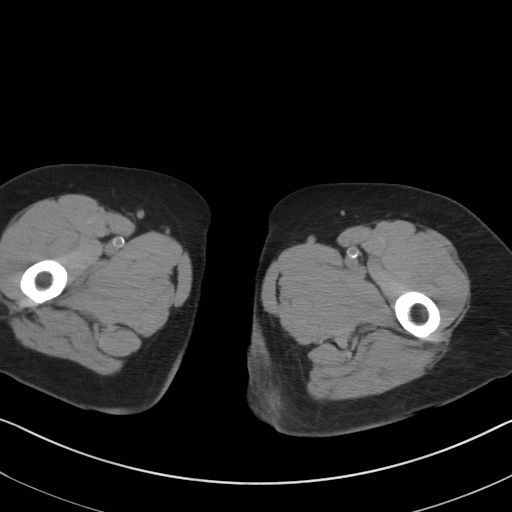
[im 4/91  bone]
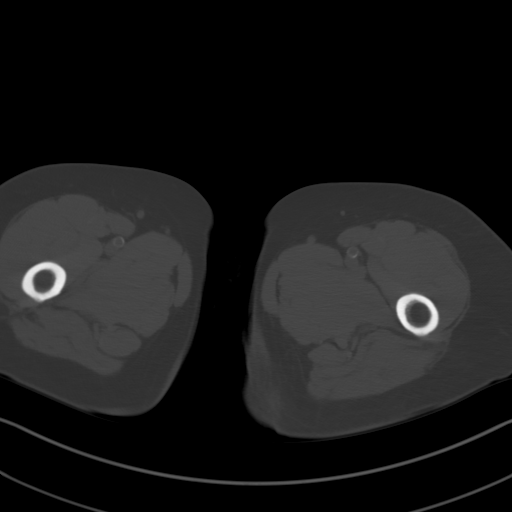
[im 11/91  soft-tissue]
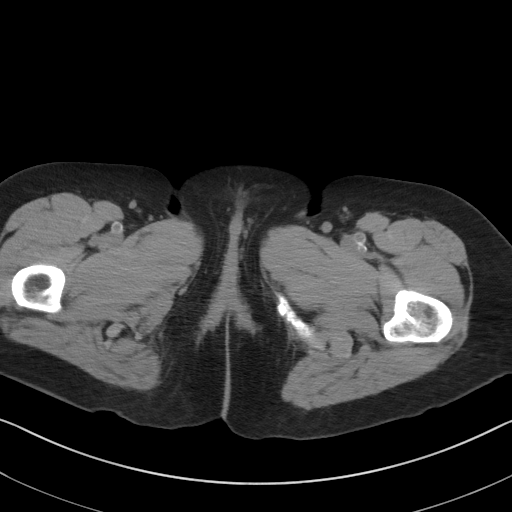
[im 19/91  soft-tissue]
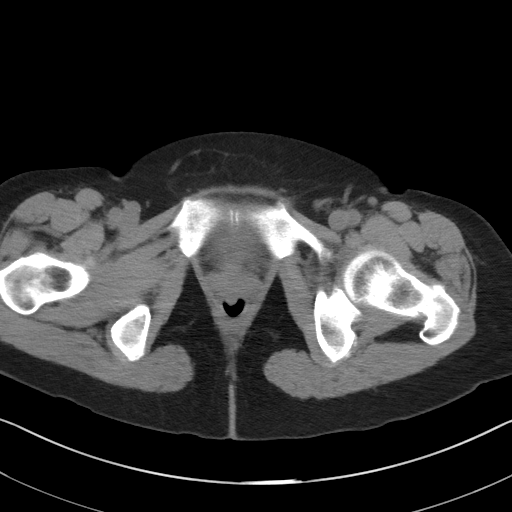
[im 26/91  soft-tissue]
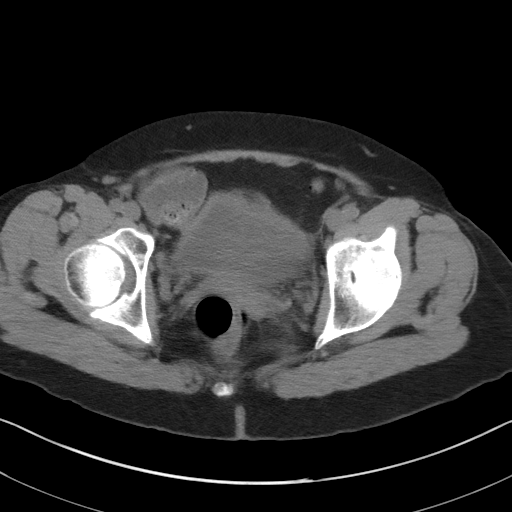
[im 33/91  soft-tissue]
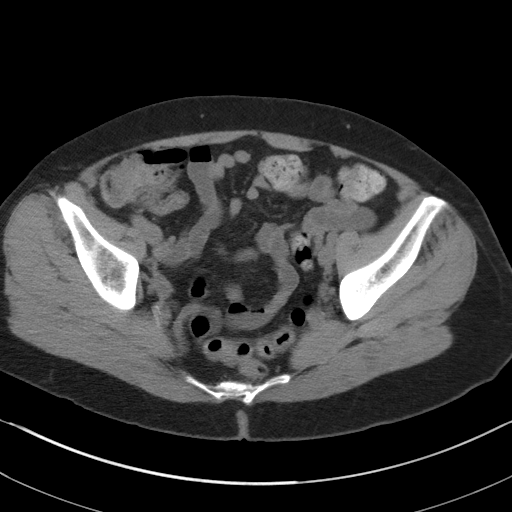
[im 40/91  soft-tissue]
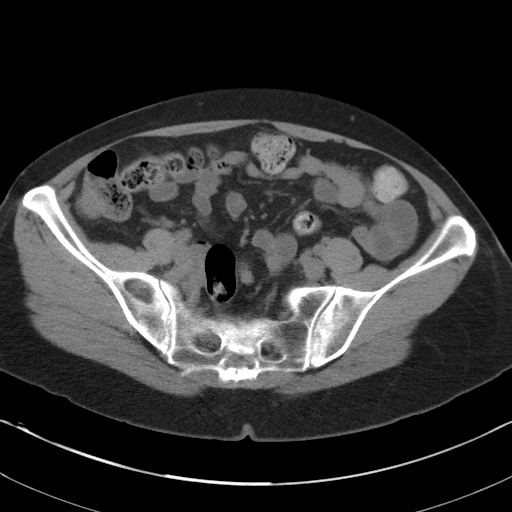
[im 47/91  soft-tissue]
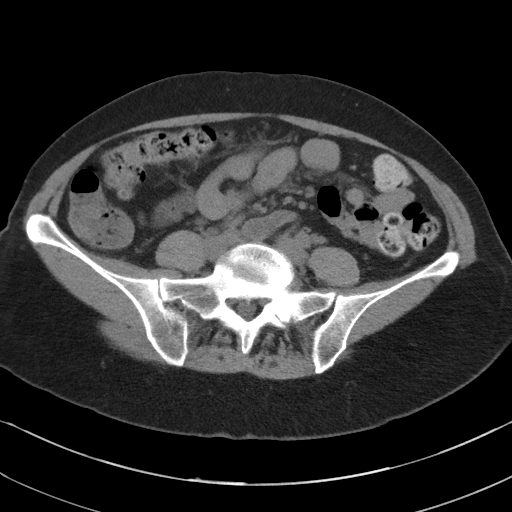
[im 51/91  soft-tissue]
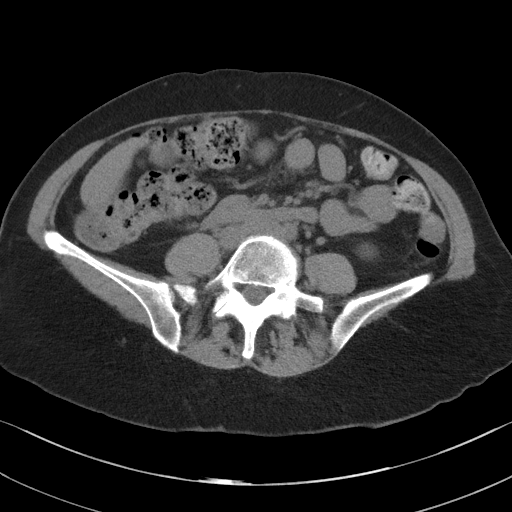
[im 58/91  soft-tissue]
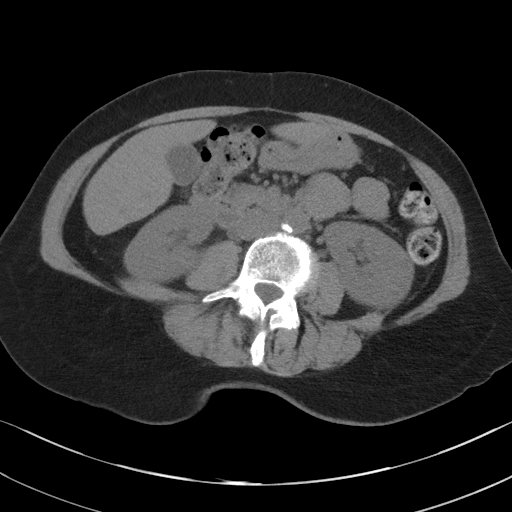
[im 58/91  bone]
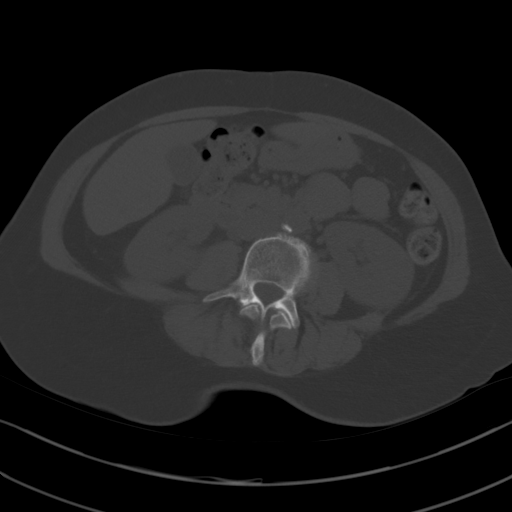
[im 65/91  soft-tissue]
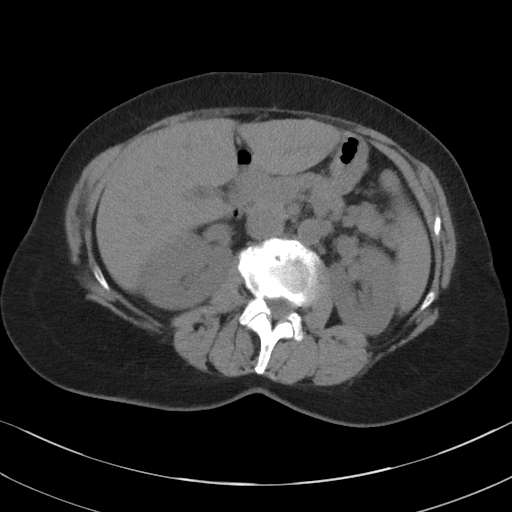
[im 73/91  soft-tissue]
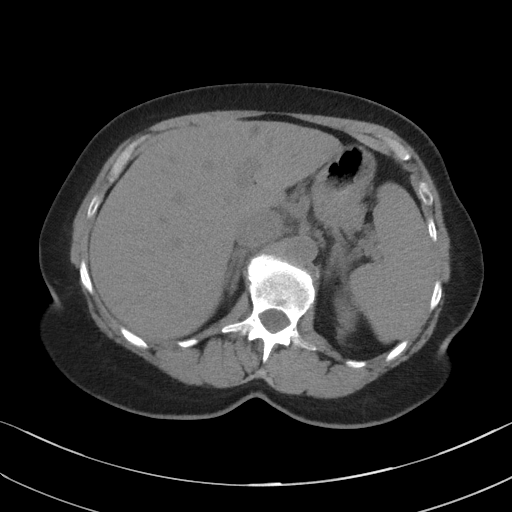
[im 80/91  soft-tissue]
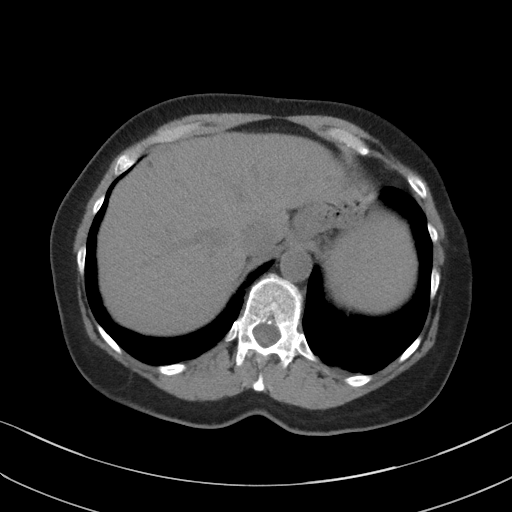
[im 87/91  soft-tissue]
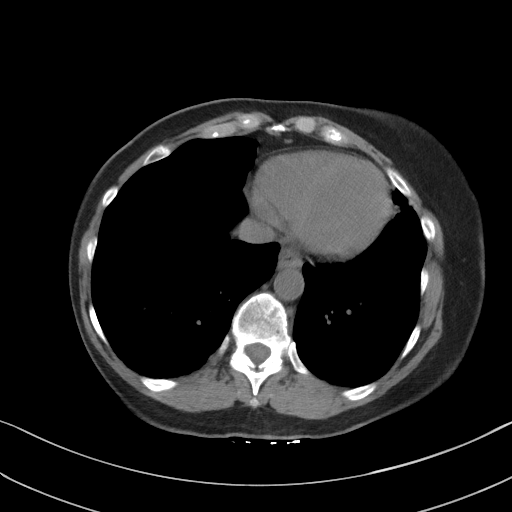

[Series 5: coronal st · coronal · 0.84mm/px · 3 of 82 slices shown]
[im 28/82  soft-tissue]
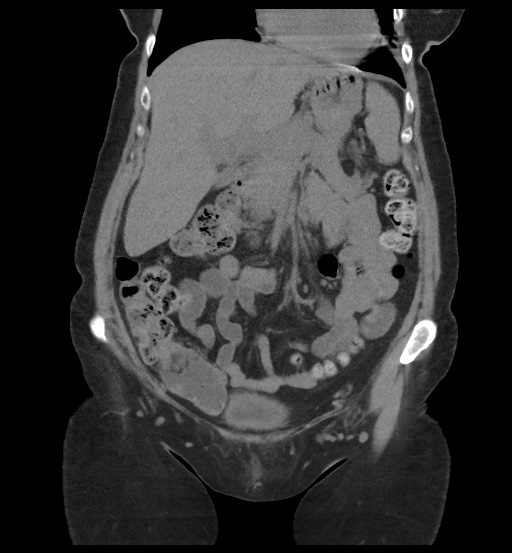
[im 37/82  soft-tissue]
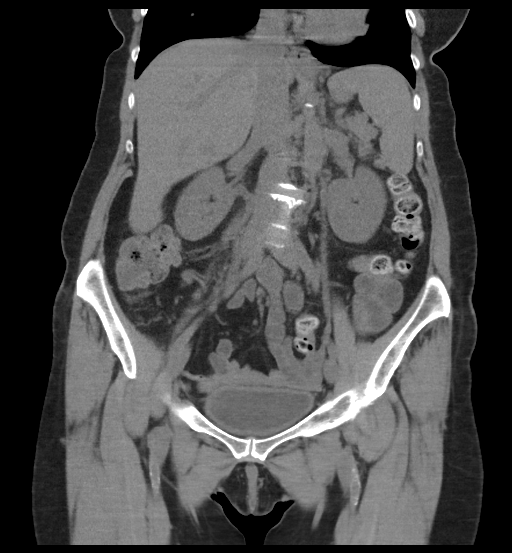
[im 46/82  soft-tissue]
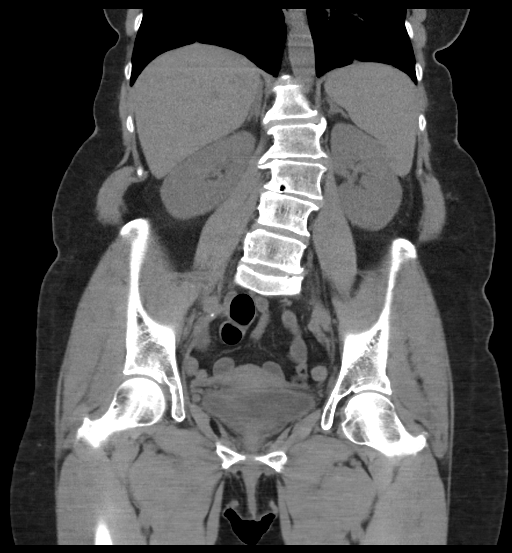

[16 of 46 positions shown; findings below may reference images not displayed]

FINDINGS: Lower chest: The lung bases are clear. Visualized cardiac structures
are within normal limits for size. No pericardial effusion.
Unremarkable visualized distal thoracic esophagus.

Hepatobiliary: Normal hepatic contour and morphology. Subcentimeter
low-attenuation hepatic lesion in segment 2 is too small for
accurate characterization but statistically highly likely a benign
cyst. Normal appearance of the gallbladder. No intra or extrahepatic
biliary ductal dilatation.

Pancreas: Unremarkable. No pancreatic ductal dilatation or
surrounding inflammatory changes.

Spleen: Normal in size without focal abnormality.

Adrenals/Urinary Tract: Adrenal glands are within normal limits. No
evidence of hydronephrosis. Punctate nonobstructing stone in the
interpolar right kidney. Ill-defined low-attenuation in the
posterior upper pole of the right kidney. The ureters and bladder
are unremarkable.

Stomach/Bowel: No evidence of obstruction or focal bowel wall
thickening. Normal appendix in the right lower quadrant. The
terminal ileum is unremarkable.

Vascular/Lymphatic: Limited evaluation in the absence of intravenous
contrast. Calcifications are present along the abdominal aorta. No
aneurysm. No suspicious lymphadenopathy.

Reproductive: Uterus and bilateral adnexa are unremarkable.

Other: No abdominal wall hernia or abnormality. No abdominopelvic
ascites.

Musculoskeletal: No acute fracture or aggressive appearing lytic or
blastic osseous lesion. Levoconvex scoliosis with associated
multifocal degenerative disc disease.
IMPRESSION: 1. Ill-defined low-attenuation in the posterior aspect of the upper
pole of the right kidney. Given the clinical history of UTI with
persistent fever, pyelonephritis or potentially renal abscess are
possibilities. Recommend further evaluation with either renal
ultrasound, or repeat CT scan of the abdomen with intravenous
contrast.
2. No evidence of hydronephrosis.
3. Punctate nephrolithiasis in the interpolar right kidney.
4.  Aortic Atherosclerosis ([id number])
5. Levoconvex scoliosis with multilevel degenerative disc disease.

## 2019-09-25 DIAGNOSIS — Z961 Presence of intraocular lens: Secondary | ICD-10-CM | POA: Diagnosis not present

## 2019-09-25 DIAGNOSIS — H02831 Dermatochalasis of right upper eyelid: Secondary | ICD-10-CM | POA: Diagnosis not present

## 2019-09-25 DIAGNOSIS — H26493 Other secondary cataract, bilateral: Secondary | ICD-10-CM | POA: Diagnosis not present

## 2019-09-25 DIAGNOSIS — H43811 Vitreous degeneration, right eye: Secondary | ICD-10-CM | POA: Diagnosis not present

## 2019-09-25 DIAGNOSIS — H02834 Dermatochalasis of left upper eyelid: Secondary | ICD-10-CM | POA: Diagnosis not present

## 2019-09-25 DIAGNOSIS — H04123 Dry eye syndrome of bilateral lacrimal glands: Secondary | ICD-10-CM | POA: Diagnosis not present

## 2019-09-25 DIAGNOSIS — H527 Unspecified disorder of refraction: Secondary | ICD-10-CM | POA: Diagnosis not present

## 2019-09-25 DIAGNOSIS — H35372 Puckering of macula, left eye: Secondary | ICD-10-CM | POA: Diagnosis not present

## 2019-10-03 ENCOUNTER — Telehealth: Payer: Self-pay | Admitting: *Deleted

## 2019-10-03 NOTE — Telephone Encounter (Signed)
Received call from Inadu (sp?) from [employer].   Reports that she would like to discuss procedure with [facility].   Please return call at [phone]~ telephone.

## 2019-11-01 ENCOUNTER — Telehealth: Payer: Self-pay | Admitting: *Deleted

## 2019-11-01 DIAGNOSIS — M8589 Other specified disorders of bone density and structure, multiple sites: Secondary | ICD-10-CM

## 2019-11-01 NOTE — Telephone Encounter (Signed)
Received call from patient.   Reports that she is due for Dexa Scan and requested orders to be placed.   Ok to order?

## 2019-11-02 ENCOUNTER — Other Ambulatory Visit: Payer: Self-pay | Admitting: Family Medicine

## 2019-11-02 DIAGNOSIS — Z1231 Encounter for screening mammogram for malignant neoplasm of breast: Secondary | ICD-10-CM

## 2019-11-02 NOTE — Telephone Encounter (Signed)
Orders placed for DEXA.   Call placed to patient and patient made aware per VM.

## 2019-11-02 NOTE — Telephone Encounter (Signed)
ok 

## 2019-12-08 ENCOUNTER — Other Ambulatory Visit: Payer: PPO

## 2019-12-08 ENCOUNTER — Other Ambulatory Visit: Payer: Self-pay

## 2019-12-08 DIAGNOSIS — E038 Other specified hypothyroidism: Secondary | ICD-10-CM

## 2019-12-08 DIAGNOSIS — Z13220 Encounter for screening for lipoid disorders: Secondary | ICD-10-CM | POA: Diagnosis not present

## 2019-12-08 DIAGNOSIS — Z136 Encounter for screening for cardiovascular disorders: Secondary | ICD-10-CM | POA: Diagnosis not present

## 2019-12-09 LAB — COMPLETE METABOLIC PANEL WITH GFR
AG Ratio: 2 (calc) (ref 1.0–2.5)
ALT: 10 U/L (ref 6–29)
AST: 15 U/L (ref 10–35)
Albumin: 4.3 g/dL (ref 3.6–5.1)
Alkaline phosphatase (APISO): 55 U/L (ref 37–153)
BUN: 10 mg/dL (ref 7–25)
CO2: 30 mmol/L (ref 20–32)
Calcium: 9.8 mg/dL (ref 8.6–10.4)
Chloride: 103 mmol/L (ref 98–110)
Creat: 0.66 mg/dL (ref 0.60–0.93)
GFR, Est African American: 104 mL/min/{1.73_m2} (ref >=60)
GFR, Est Non African American: 89 mL/min/{1.73_m2} (ref >=60)
Globulin: 2.1 g/dL (calc) (ref 1.9–3.7)
Glucose, Bld: 102 mg/dL — ABNORMAL HIGH (ref 65–99)
Potassium: 4.4 mmol/L (ref 3.5–5.3)
Sodium: 140 mmol/L (ref 135–146)
Total Bilirubin: 0.4 mg/dL (ref 0.2–1.2)
Total Protein: 6.4 g/dL (ref 6.1–8.1)

## 2019-12-09 LAB — CBC WITH DIFFERENTIAL/PLATELET
Absolute Monocytes: 491 cells/uL (ref 200–950)
Basophils Absolute: 32 cells/uL (ref 0–200)
Basophils Relative: 0.6 %
Eosinophils Absolute: 211 cells/uL (ref 15–500)
Eosinophils Relative: 3.9 %
HCT: 36.6 % (ref 35.0–45.0)
Hemoglobin: 12.2 g/dL (ref 11.7–15.5)
Lymphs Abs: 1426 cells/uL (ref 850–3900)
MCH: 31 pg (ref 27.0–33.0)
MCHC: 33.3 g/dL (ref 32.0–36.0)
MCV: 92.9 fL (ref 80.0–100.0)
MPV: 9.5 fL (ref 7.5–12.5)
Monocytes Relative: 9.1 %
Neutro Abs: 3240 cells/uL (ref 1500–7800)
Neutrophils Relative %: 60 %
Platelets: 253 10*3/uL (ref 140–400)
RBC: 3.94 10*6/uL (ref 3.80–5.10)
RDW: 12 % (ref 11.0–15.0)
Total Lymphocyte: 26.4 %
WBC: 5.4 10*3/uL (ref 3.8–10.8)

## 2019-12-09 LAB — LIPID PANEL
Cholesterol: 196 mg/dL (ref <200)
HDL: 71 mg/dL (ref >=50)
LDL Cholesterol (Calc): 109 mg/dL (calc) — ABNORMAL HIGH
Non-HDL Cholesterol (Calc): 125 mg/dL (calc) (ref <130)
Total CHOL/HDL Ratio: 2.8 (calc) (ref <5.0)
Triglycerides: 75 mg/dL (ref <150)

## 2019-12-09 LAB — TSH: TSH: 4.83 mIU/L — ABNORMAL HIGH (ref 0.40–4.50)

## 2019-12-13 ENCOUNTER — Encounter: Payer: Self-pay | Admitting: Family Medicine

## 2019-12-13 ENCOUNTER — Ambulatory Visit (INDEPENDENT_AMBULATORY_CARE_PROVIDER_SITE_OTHER): Payer: PPO | Admitting: Family Medicine

## 2019-12-13 ENCOUNTER — Other Ambulatory Visit: Payer: Self-pay

## 2019-12-13 VITALS — BP 130/72 | HR 72 | Temp 97.9°F | Resp 16 | Ht 67.0 in | Wt 140.0 lb

## 2019-12-13 DIAGNOSIS — E038 Other specified hypothyroidism: Secondary | ICD-10-CM

## 2019-12-13 DIAGNOSIS — Z0000 Encounter for general adult medical examination without abnormal findings: Secondary | ICD-10-CM

## 2019-12-13 DIAGNOSIS — M8589 Other specified disorders of bone density and structure, multiple sites: Secondary | ICD-10-CM | POA: Diagnosis not present

## 2019-12-13 DIAGNOSIS — N898 Other specified noninflammatory disorders of vagina: Secondary | ICD-10-CM | POA: Diagnosis not present

## 2019-12-13 DIAGNOSIS — Z0001 Encounter for general adult medical examination with abnormal findings: Secondary | ICD-10-CM

## 2019-12-13 NOTE — Patient Instructions (Addendum)
F/U 1 year for Physical  We will call with Bone Density and Mammogram Results  Return for flu shot and thyroid labs in Oct

## 2019-12-13 NOTE — Progress Notes (Signed)
Subjective:   Patient presents for Medicare Annual/Subsequent preventive examination.  Doing well no concerns   Had cataract surgery on both eyes in December, no longer as to use glasses He had eye visit back in June there were no concerns.  Feels like her vision is doing well.    Hypothyroidism taking synthroid, states she she has only missed 2 doses over the past year.  Her TSH was mildly elevated at 4.8 but she does not have any hypothyroid symptoms.  That she feels well.  She has been changing her diet and trying to eat healthier and has lost a few pounds.  Fasting labs reviewed at bedside mildly elevated LDL at 109  She has a new partner, has vaginal dryness, requested something to help with this   Review Past Medical/Family/Social: Per EMR    Risk Factors  Current exercise habits: walks , stays active  Dietary issues discussed: no concerns  Cardiac risk factors: none  Depression Screen  (Note: if answer to either of the following is "Yes", a more complete depression screening is indicated)  Over the past two weeks, have you felt down, depressed or hopeless? No Over the past two weeks, have you felt little interest or pleasure in doing things? No Have you lost interest or pleasure in daily life? No Do you often feel hopeless? No Do you cry easily over simple problems? No   Activities of Daily Living  In your present state of health, do you have any difficulty performing the following activities?:  Driving? No  Managing money? No  Feeding yourself? No  Getting from bed to chair? No  Climbing a flight of stairs? No  Preparing food and eating?: No  Bathing or showering? No  Getting dressed: No  Getting to the toilet? No  Using the toilet:No  Moving around from place to place: No  In the past year have you fallen or had a near fall?:No  Are you sexually active? Yes, she has noticed vaginal dryness with her new partner.  Wants know if there are any lubricants that  she could use. Do you have more than one partner? No   Hearing Difficulties: No  Do you often ask people to speak up or repeat themselves? No  Do you experience ringing or noises in your ears? No Do you have difficulty understanding soft or whispered voices? No  Do you feel that you have a problem with memory? No Do you often misplace items? No  Do you feel safe at home? Yes  Cognitive Testing  Alert? Yes Normal Appearance?Yes  Oriented to person? Yes Place? Yes  Time? Yes  Recall of three objects? Yes  Can perform simple calculations? Yes  Displays appropriate judgment?Yes  Can read the correct time from a watch face?Yes   List the Names of Other Physician/Practitioners you currently use:  Eye doctor   Screening Tests / Dateothers UTD Colonoscopy UTD 2017 Shingrix- UTD PNA UTD Mammogram OTC Bone Density - Scheduled for Oct  Influenza Vaccine  Tetanus/tdap UTD COVID-19  UTD   ROS: GEN- denies fatigue, fever, weight loss,weakness, recent illness HEENT- denies eye drainage, change in vision, nasal discharge, CVS- denies chest pain, palpitations RESP- denies SOB, cough, wheeze ABD- denies N/V, change in stools, abd pain GU- denies dysuria, hematuria, dribbling, incontinence MSK- denies joint pain, muscle aches, injury Neuro- denies headache, dizziness, syncope, seizure activity  PHYSICAL-vitals  GEN- NAD, alert and oriented x3 HEENT- PERRL, EOMI, non injected sclera, pink conjunctiva, MMM, oropharynx clear  Neck- Supple, no thryomegaly CVS- RRR, no murmur RESP-CTAB ABD-NABS,soft,NT,ND Psych-Normal affect and mood  EXT- No edema Pulses- Radial, DP- 2+   Assessment:    Annual wellness medicare exam   Plan:    During the course of the visit the patient was educated and counseled about appropriate screening and preventive services including:   Osteopenia- bone density to be done 2021, continue vitamin D Mammogram scheduled     SHe will f/u with dentist /eye doctor AS ascheduled   Return for Flu shot, otherwise immunizations UTD    Audit C/ Fall/Depression screen negative    Full Code, Son and daughter are POA      -doesn't want life extended on life support   Hypothyroidism TSH mildly elevated but no symptoms, recheck thyroid labs in Oct when she returns for her flu shot   Vaginal dryness expected at her age/post menopausal try RePLens  no abnormal bleeding or discharge    Diet review for nutrition referral? Yes ____ Not Indicated __x__  Patient Instructions (the written plan) was given to the patient.  Medicare Attestation  I have personally reviewed:  The patient's medical and social history  Their use of alcohol, tobacco or illicit drugs  Their current medications and supplements  The patient's functional ability including ADLs,fall risks, home safety risks, cognitive, and hearing and visual impairment  Diet and physical activities  Evidence for depression or mood disorders  The patient's weight, height, BMI, and visual acuity have been recorded in the chart. I have made referrals, counseling, and provided education to the patient based on review of the above and I have provided the patient with a written personalized care plan for preventive services.

## 2020-01-18 ENCOUNTER — Ambulatory Visit (INDEPENDENT_AMBULATORY_CARE_PROVIDER_SITE_OTHER): Payer: PPO

## 2020-01-18 ENCOUNTER — Other Ambulatory Visit: Payer: Self-pay

## 2020-01-18 ENCOUNTER — Other Ambulatory Visit: Payer: PPO

## 2020-01-18 DIAGNOSIS — E038 Other specified hypothyroidism: Secondary | ICD-10-CM | POA: Diagnosis not present

## 2020-01-18 DIAGNOSIS — Z23 Encounter for immunization: Secondary | ICD-10-CM | POA: Diagnosis not present

## 2020-01-18 LAB — T3, FREE: T3, Free: 3.1 pg/mL (ref 2.3–4.2)

## 2020-01-18 LAB — TSH: TSH: 3.6 mIU/L (ref 0.40–4.50)

## 2020-01-18 LAB — T4, FREE: Free T4: 1.5 ng/dL (ref 0.8–1.8)

## 2020-01-19 ENCOUNTER — Encounter: Payer: Self-pay | Admitting: *Deleted

## 2020-01-26 ENCOUNTER — Ambulatory Visit
Admission: RE | Admit: 2020-01-26 | Discharge: 2020-01-26 | Disposition: A | Discharge status: Home / Self Care | Payer: PPO | Source: Ambulatory Visit | Attending: Family Medicine | Admitting: Family Medicine

## 2020-01-26 ENCOUNTER — Other Ambulatory Visit: Payer: Self-pay

## 2020-01-26 DIAGNOSIS — Z1231 Encounter for screening mammogram for malignant neoplasm of breast: Secondary | ICD-10-CM | POA: Diagnosis not present

## 2020-01-26 DIAGNOSIS — M85852 Other specified disorders of bone density and structure, left thigh: Secondary | ICD-10-CM | POA: Diagnosis not present

## 2020-01-26 DIAGNOSIS — M8589 Other specified disorders of bone density and structure, multiple sites: Secondary | ICD-10-CM

## 2020-01-26 DIAGNOSIS — Z780 Asymptomatic menopausal state: Secondary | ICD-10-CM | POA: Diagnosis not present

## 2020-01-29 ENCOUNTER — Encounter: Payer: Self-pay | Admitting: *Deleted

## 2020-02-27 IMAGING — US US RENAL
1 series · 14 of 25 positions shown · non-contrast
Comparison: CT scan abdomen/pelvis 12/11/2017

CLINICAL DATA: 66-year-old female with UTI, persistent fever and
abnormality in the upper pole of the right kidney seen CT scan of
the abdomen and pelvis.

EXAM:
RENAL / URINARY TRACT ULTRASOUND COMPLETE

[Series 1: us renal · 0.22mm/px · 71 acquisitions, 14 frames shown]
[im 1/71]
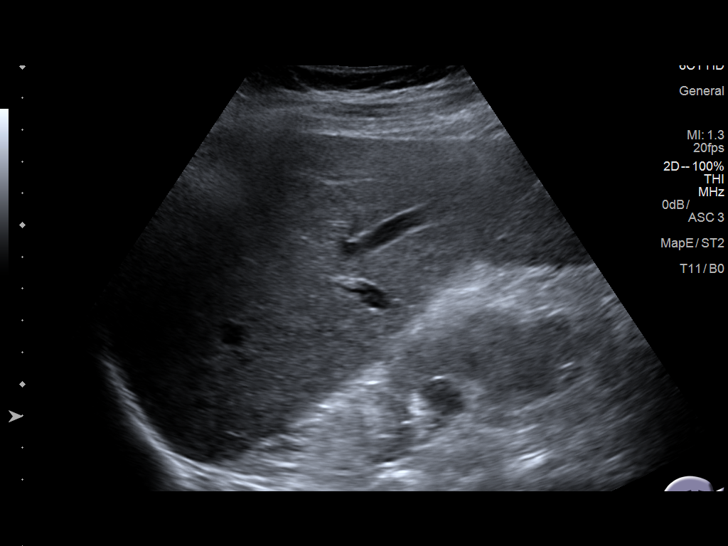
[im 6/71]
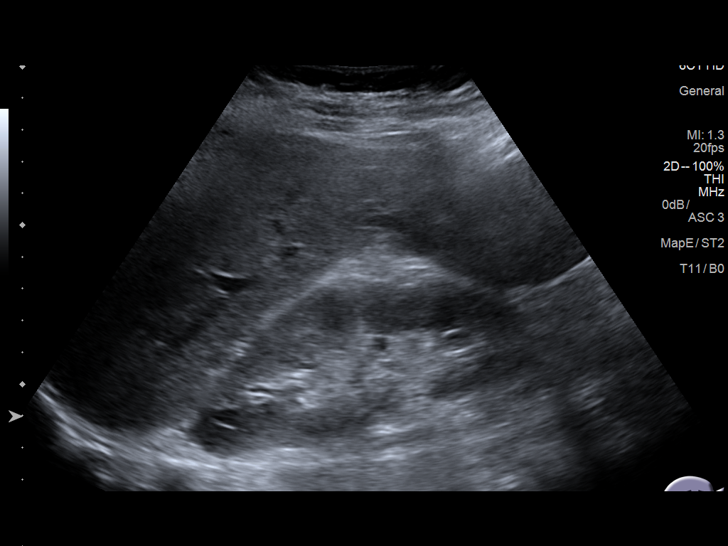
[im 12/71]
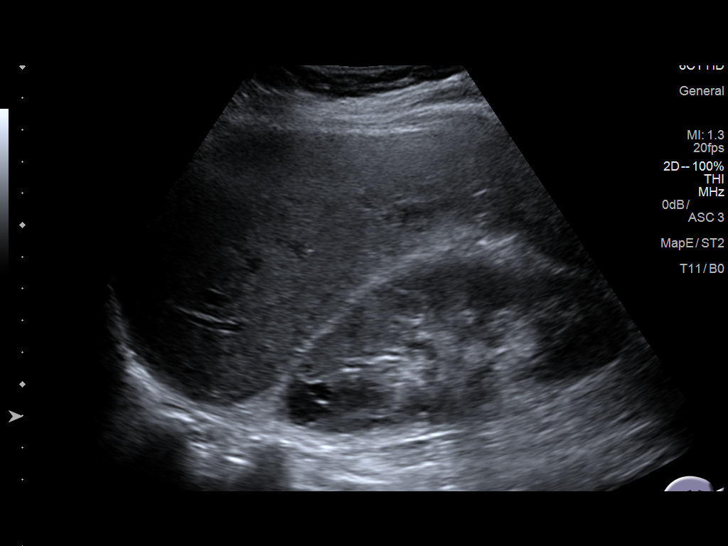
[im 18/71]
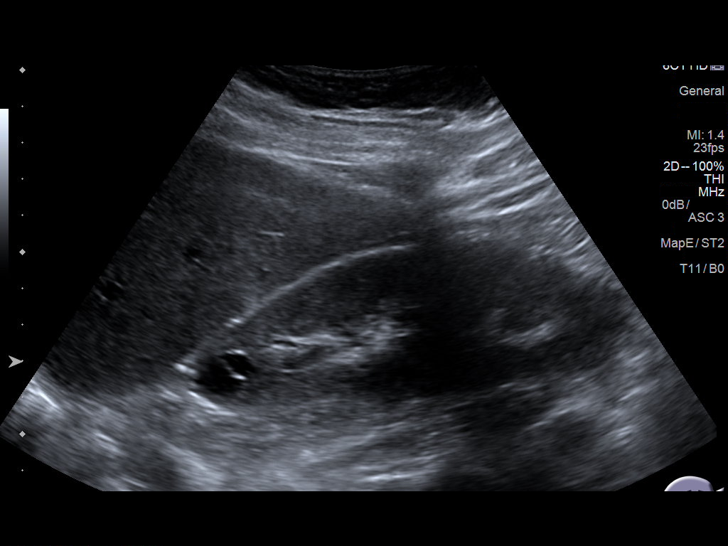
[im 24/71]
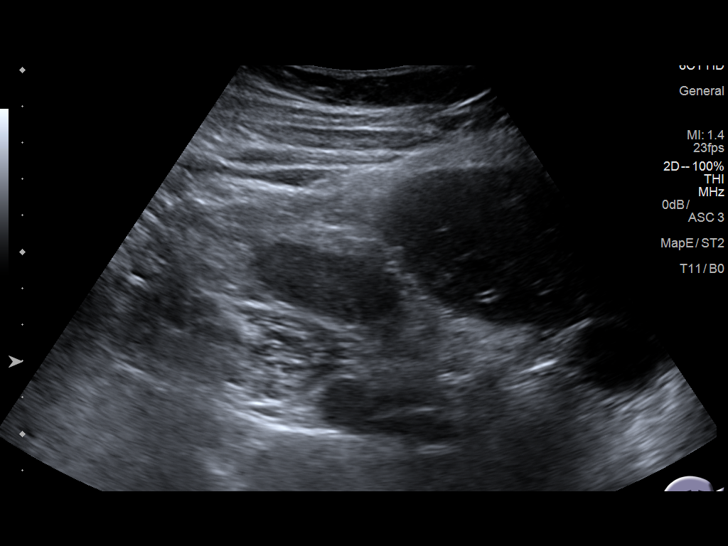
[im 27/71]
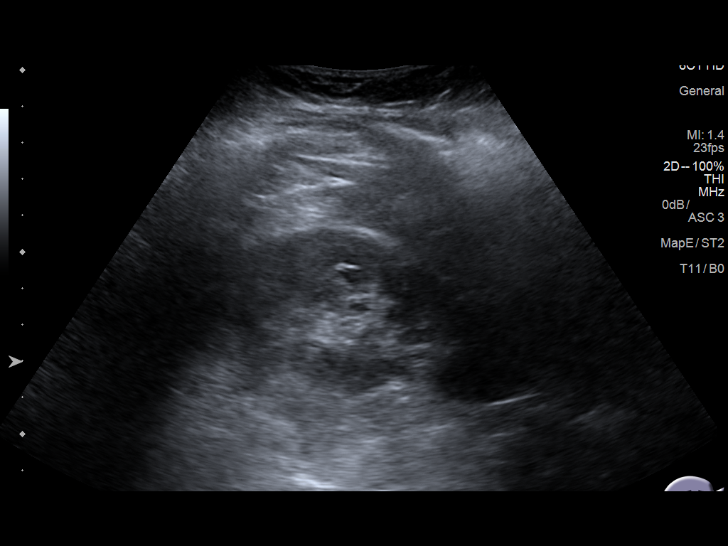
[im 33/71]
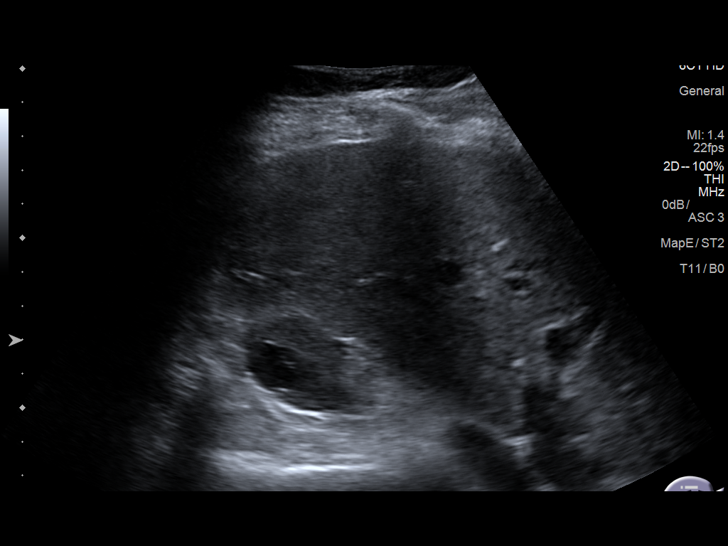
[im 38/71]
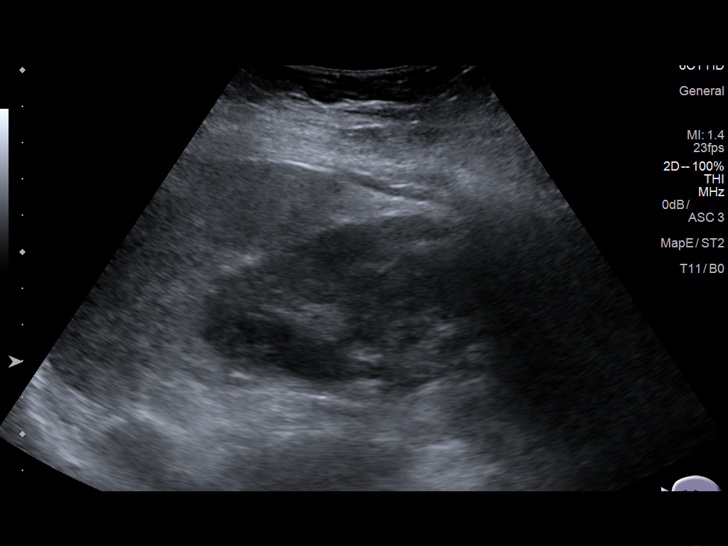
[im 44/71]
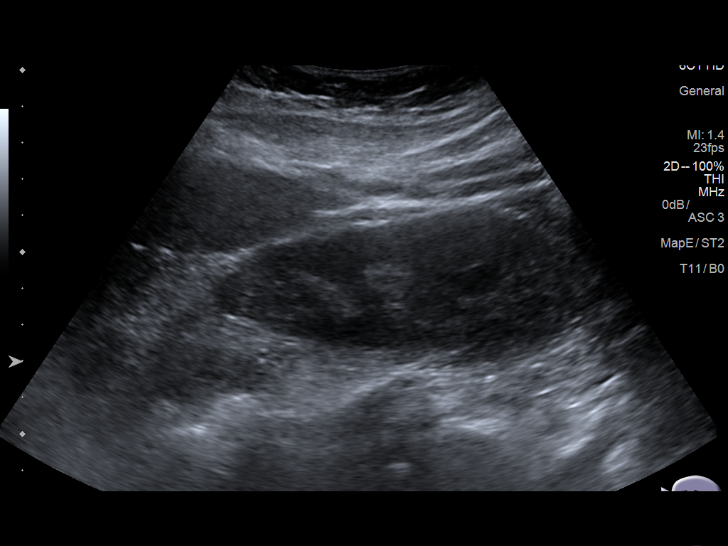
[im 47/71]
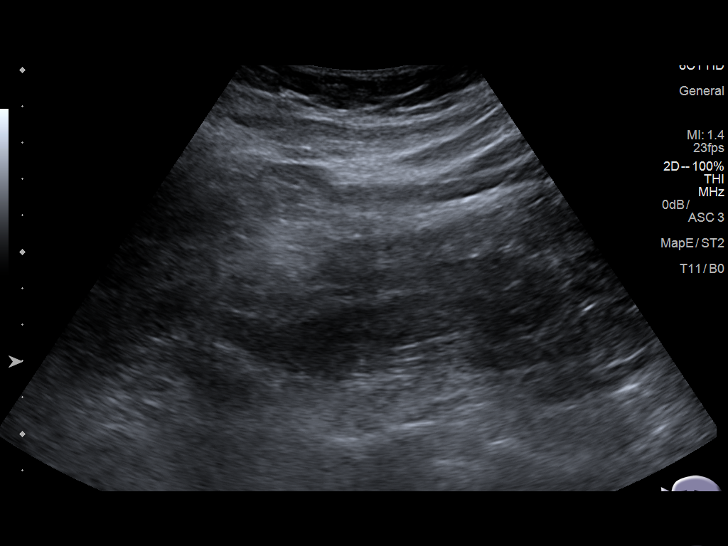
[im 53/71]
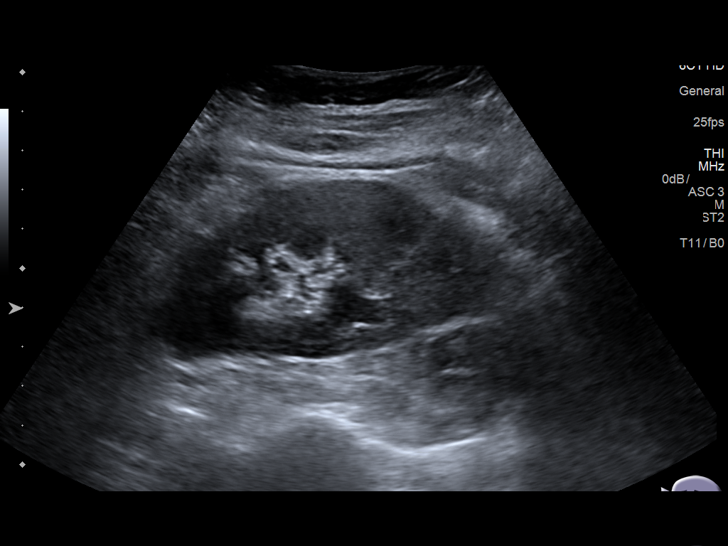
[im 59/71]
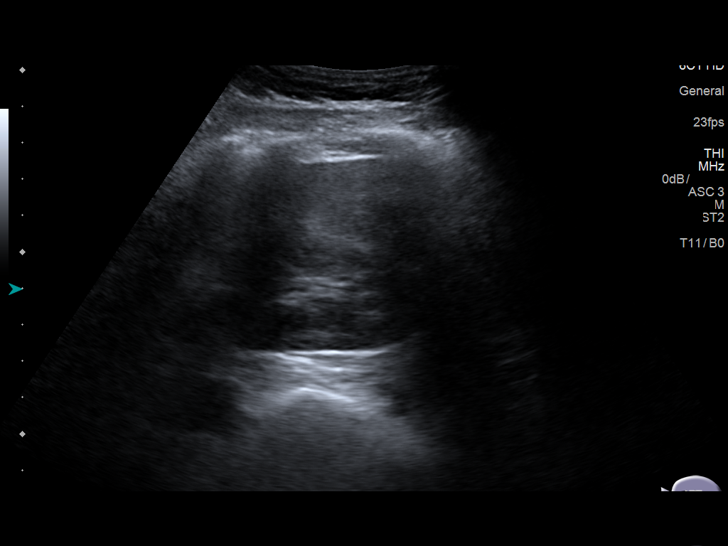
[im 65/71]
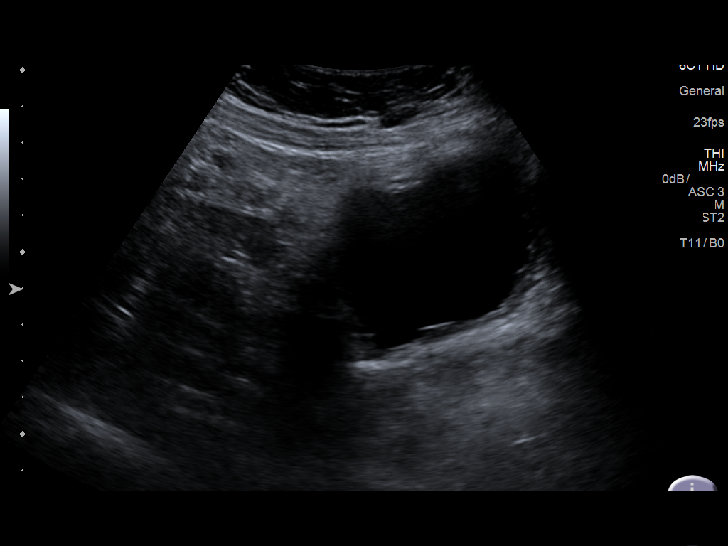
[im 71/71]
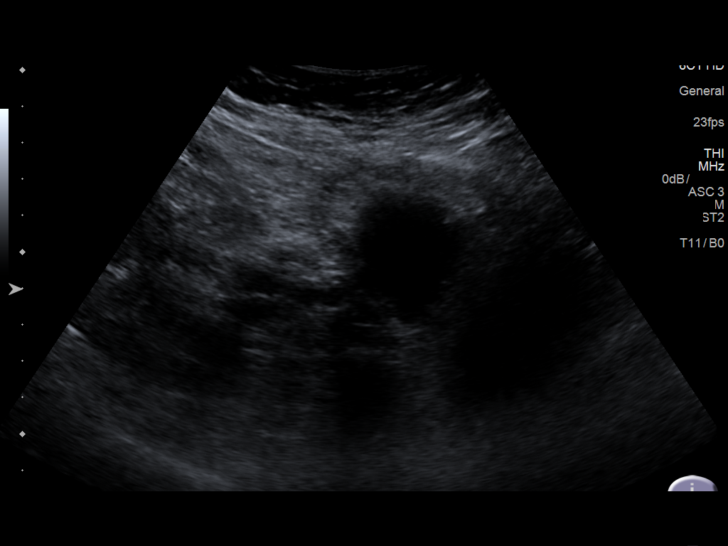

[14 of 25 positions shown; findings below may reference images not displayed]

FINDINGS: Right Kidney:

Length: 11.8 cm. Normal parenchymal echogenicity. No evidence of
hydronephrosis. Anechoic cystic structure with a thin internal
septation present in the upper pole. Overall, the septated cyst
measures 1.6 x 1.7 x 1.7 cm. There is posterior acoustic
enhancement. No evidence of internal complexity to suggest abscess
peer

Left Kidney:

Length: 12.3 cm. Echogenicity within normal limits. No mass or
hydronephrosis visualized.

Bladder:

Appears normal for degree of bladder distention. Both ureteral jets
are visualized.
IMPRESSION: Minimally complex (single thin internal septation) 1.7 cm cyst in
the region of concern in the upper pole of the right kidney. No
sonographic features to suggest abscess.

## 2020-03-13 ENCOUNTER — Other Ambulatory Visit: Payer: Self-pay | Admitting: Family Medicine

## 2020-03-13 DIAGNOSIS — E038 Other specified hypothyroidism: Secondary | ICD-10-CM

## 2020-07-25 ENCOUNTER — Encounter: Payer: Self-pay | Admitting: Plastic Surgery

## 2020-07-25 ENCOUNTER — Other Ambulatory Visit: Payer: Self-pay

## 2020-07-25 ENCOUNTER — Ambulatory Visit (INDEPENDENT_AMBULATORY_CARE_PROVIDER_SITE_OTHER): Payer: Self-pay | Admitting: Plastic Surgery

## 2020-07-25 DIAGNOSIS — Z719 Counseling, unspecified: Secondary | ICD-10-CM | POA: Insufficient documentation

## 2020-07-25 NOTE — Progress Notes (Signed)
.  csbo Botulinum Toxin Procedure Note  Procedure: Cosmetic botulinum toxin  Pre-operative Diagnosis: Dynamic rhytides   Post-operative Diagnosis: Same  Complications:  None  Brief history: The patient desires botulinum toxin injection of her forehead. I discussed with the patient this proposed procedure of botulinum toxin injections, which is customized depending on the particular needs of the patient. It is performed on facial rhytids as a temporary correction. The alternatives were discussed with the patient. The risks were addressed including bleeding, scarring, infection, damage to deeper structures, asymmetry, and chronic pain, which may occur infrequently after a procedure. The individual's choice to undergo a surgical procedure is based on the comparison of risks to potential benefits. Other risks include unsatisfactory results, brow ptosis, eyelid ptosis, allergic reaction, temporary paralysis, which should go away with time, bruising, blurring disturbances and delayed healing. Botulinum toxin injections do not arrest the aging process or produce permanent tightening of the eyelid.  Operative intervention maybe necessary to maintain the results of a blepharoplasty or botulinum toxin. The patient understands and wishes to proceed.  Procedure: The area was prepped with alcohol and dried with a clean gauze. Using a clean technique, the botulinum toxin was diluted with 1.25 cc of preservative-free normal saline which was slowly injected with an 18 gauge needle in a tuberculin syringes.  A 32 gauge needles were then used to inject the botulinum toxin. This mixture allow for an aliquot of 5 units per 0.1 cc in each injection site.    Subsequently the mixture was injected in the glabellar and forehead area with preservation of the temporal branch to the lateral eyebrow as well as into each lateral canthal area beginning from the lateral orbital rim medial to the zygomaticus major in 3 separate areas.  A total of 30 Units of botulinum toxin was used. The forehead and glabellar area was injected with care to inject intramuscular only while holding pressure on the supratrochlear vessels in each area during each injection on either side of the medial corrugators. The injection proceeded vertically superiorly to the medial 2/3 of the frontalis muscle and superior 2/3 of the lateral frontalis, again with preservation of the frontal branch. No complications were noted. Light pressure was held for 5 minutes. She was instructed explicitly in post-operative care.  Botox LOT:  [id number] C2 EXP:  6/24

## 2020-12-13 ENCOUNTER — Ambulatory Visit: Payer: PPO | Admitting: Family Medicine

## 2020-12-24 ENCOUNTER — Other Ambulatory Visit: Payer: Self-pay

## 2020-12-27 ENCOUNTER — Encounter: Payer: Self-pay | Admitting: Family Medicine

## 2020-12-27 ENCOUNTER — Ambulatory Visit (INDEPENDENT_AMBULATORY_CARE_PROVIDER_SITE_OTHER): Payer: PPO | Admitting: Family Medicine

## 2020-12-27 ENCOUNTER — Other Ambulatory Visit: Payer: Self-pay

## 2020-12-27 VITALS — BP 123/67 | HR 76 | Temp 98.1°F | Ht 65.5 in | Wt 138.0 lb

## 2020-12-27 DIAGNOSIS — E038 Other specified hypothyroidism: Secondary | ICD-10-CM | POA: Diagnosis not present

## 2020-12-27 DIAGNOSIS — Z1231 Encounter for screening mammogram for malignant neoplasm of breast: Secondary | ICD-10-CM | POA: Diagnosis not present

## 2020-12-27 DIAGNOSIS — Z130 Encounter for screening for diseases of the blood and blood-forming organs and certain disorders involving the immune mechanism: Secondary | ICD-10-CM | POA: Diagnosis not present

## 2020-12-27 DIAGNOSIS — Z131 Encounter for screening for diabetes mellitus: Secondary | ICD-10-CM | POA: Diagnosis not present

## 2020-12-27 DIAGNOSIS — Z1159 Encounter for screening for other viral diseases: Secondary | ICD-10-CM | POA: Diagnosis not present

## 2020-12-27 DIAGNOSIS — M8589 Other specified disorders of bone density and structure, multiple sites: Secondary | ICD-10-CM

## 2020-12-27 DIAGNOSIS — Z7689 Persons encountering health services in other specified circumstances: Secondary | ICD-10-CM

## 2020-12-27 DIAGNOSIS — Z0000 Encounter for general adult medical examination without abnormal findings: Secondary | ICD-10-CM | POA: Diagnosis not present

## 2020-12-27 DIAGNOSIS — Z23 Encounter for immunization: Secondary | ICD-10-CM

## 2020-12-27 LAB — CBC WITH DIFFERENTIAL/PLATELET
Basophils Absolute: 0 10*3/uL (ref 0.0–0.1)
Basophils Relative: 0.6 % (ref 0.0–3.0)
Eosinophils Absolute: 0.2 10*3/uL (ref 0.0–0.7)
Eosinophils Relative: 3.5 % (ref 0.0–5.0)
HCT: 38 % (ref 36.0–46.0)
Hemoglobin: 12.6 g/dL (ref 12.0–15.0)
Lymphocytes Relative: 25.9 % (ref 12.0–46.0)
Lymphs Abs: 1.5 10*3/uL (ref 0.7–4.0)
MCHC: 33.1 g/dL (ref 30.0–36.0)
MCV: 93 fl (ref 78.0–100.0)
Monocytes Absolute: 0.6 10*3/uL (ref 0.1–1.0)
Monocytes Relative: 10.7 % (ref 3.0–12.0)
Neutro Abs: 3.4 10*3/uL (ref 1.4–7.7)
Neutrophils Relative %: 59.3 % (ref 43.0–77.0)
Platelets: 259 10*3/uL (ref 150.0–400.0)
RBC: 4.09 Mil/uL (ref 3.87–5.11)
RDW: 13.3 % (ref 11.5–15.5)
WBC: 5.7 10*3/uL (ref 4.0–10.5)

## 2020-12-27 LAB — T4, FREE: Free T4: 1.12 ng/dL (ref 0.60–1.60)

## 2020-12-27 LAB — COMPREHENSIVE METABOLIC PANEL
ALT: 11 U/L (ref 0–35)
AST: 17 U/L (ref 0–37)
Albumin: 4.4 g/dL (ref 3.5–5.2)
Alkaline Phosphatase: 58 U/L (ref 39–117)
BUN: 11 mg/dL (ref 6–23)
CO2: 29 mEq/L (ref 19–32)
Calcium: 9.9 mg/dL (ref 8.4–10.5)
Chloride: 101 mEq/L (ref 96–112)
Creatinine, Ser: 0.68 mg/dL (ref 0.40–1.20)
GFR: 87.35 mL/min (ref >60.00)
Glucose, Bld: 94 mg/dL (ref 70–99)
Potassium: 4.2 mEq/L (ref 3.5–5.1)
Sodium: 139 mEq/L (ref 135–145)
Total Bilirubin: 0.7 mg/dL (ref 0.2–1.2)
Total Protein: 7.1 g/dL (ref 6.0–8.3)

## 2020-12-27 LAB — LIPID PANEL
Cholesterol: 218 mg/dL — ABNORMAL HIGH (ref 0–200)
HDL: 75.4 mg/dL (ref >39.00)
LDL Cholesterol: 122 mg/dL — ABNORMAL HIGH (ref 0–99)
NonHDL: 142.48
Total CHOL/HDL Ratio: 3
Triglycerides: 100 mg/dL (ref 0.0–149.0)
VLDL: 20 mg/dL (ref 0.0–40.0)

## 2020-12-27 LAB — T3, FREE: T3, Free: 3 pg/mL (ref 2.3–4.2)

## 2020-12-27 LAB — HEMOGLOBIN A1C: Hgb A1c MFr Bld: 5.8 % (ref 4.6–6.5)

## 2020-12-27 LAB — TSH: TSH: 3.79 u[IU]/mL (ref 0.35–5.50)

## 2020-12-27 MED ORDER — TETANUS-DIPHTH-ACELL PERTUSSIS 5-2.5-18.5 LF-MCG/0.5 IM SUSP: 0.5000 mL | Freq: Once | INTRAMUSCULAR | 0 refills | Status: AC

## 2020-12-27 MED ORDER — SHINGRIX 50 MCG/0.5ML IM SUSR: 0.5000 mL | Freq: Once | INTRAMUSCULAR | 1 refills | Status: AC

## 2020-12-27 MED ORDER — LEVOTHYROXINE SODIUM 100 MCG PO TABS: 100.0000 ug | ORAL_TABLET | Freq: Every day | ORAL | 3 refills | Status: DC

## 2020-12-27 NOTE — Patient Instructions (Signed)
Great to see you today.  I have refilled the medication(s) we provide.   If labs were collected, we will inform you of lab results once received either by echart message or telephone call.   - echart message- for normal results that have been seen by the patient already.   - telephone call: abnormal results or if patient has not viewed results in their echart.   Health Maintenance After Age 72 After age 72, you are at a higher risk for certain long-term diseases and infections as well as injuries from falls. Falls are a major cause of broken bones and head injuries in people who are older than age 72. Getting regular preventive care can help to keep you healthy and well. Preventive care includes getting regular testing and making lifestyle changes as recommended by your health care provider. Talk with your health care provider about: Which screenings and tests you should have. A screening is a test that checks for a disease when you have no symptoms. A diet and exercise plan that is right for you. What should I know about screenings and tests to prevent falls? Screening and testing are the best ways to find a health problem early. Early diagnosis and treatment give you the best chance of managing medical conditions that are common after age 72. Certain conditions and lifestyle choices may make you more likely to have a fall. Your health care provider may recommend: Regular vision checks. Poor vision and conditions such as cataracts can make you more likely to have a fall. If you wear glasses, make sure to get your prescription updated if your vision changes. Medicine review. Work with your health care provider to regularly review all of the medicines you are taking, including over-the-counter medicines. Ask your health care provider about any side effects that may make you more likely to have a fall. Tell your health care provider if any medicines that you take make you feel dizzy or  sleepy. Osteoporosis screening. Osteoporosis is a condition that causes the bones to get weaker. This can make the bones weak and cause them to break more easily. Blood pressure screening. Blood pressure changes and medicines to control blood pressure can make you feel dizzy. Strength and balance checks. Your health care provider may recommend certain tests to check your strength and balance while standing, walking, or changing positions. Foot health exam. Foot pain and numbness, as well as not wearing proper footwear, can make you more likely to have a fall. Depression screening. You may be more likely to have a fall if you have a fear of falling, feel emotionally low, or feel unable to do activities that you used to do. Alcohol use screening. Using too much alcohol can affect your balance and may make you more likely to have a fall. What actions can I take to lower my risk of falls? General instructions Talk with your health care provider about your risks for falling. Tell your health care provider if: You fall. Be sure to tell your health care provider about all falls, even ones that seem minor. You feel dizzy, sleepy, or off-balance. Take over-the-counter and prescription medicines only as told by your health care provider. These include any supplements. Eat a healthy diet and maintain a healthy weight. A healthy diet includes low-fat dairy products, low-fat (lean) meats, and fiber from whole grains, beans, and lots of fruits and vegetables. Home safety Remove any tripping hazards, such as rugs, cords, and clutter. Install safety equipment such as   grab bars in bathrooms and safety rails on stairs. Keep rooms and walkways well-lit. Activity  Follow a regular exercise program to stay fit. This will help you maintain your balance. Ask your health care provider what types of exercise are appropriate for you. If you need a cane or walker, use it as recommended by your health care provider. Wear  supportive shoes that have nonskid soles. Lifestyle Do not drink alcohol if your health care provider tells you not to drink. If you drink alcohol, limit how much you have: 0-1 drink a day for women. 0-2 drinks a day for men. Be aware of how much alcohol is in your drink. In the [place] one drink equals one typical bottle of beer (12 oz), one-half glass of wine (5 oz), or one shot of hard liquor (1 oz). Do not use any products that contain nicotine or tobacco, such as cigarettes and e-cigarettes. If you need help quitting, ask your health care provider. Summary Having a healthy lifestyle and getting preventive care can help to protect your health and wellness after age 72. Screening and testing are the best way to find a health problem early and help you avoid having a fall. Early diagnosis and treatment give you the best chance for managing medical conditions that are more common for people who are older than age 72. Falls are a major cause of broken bones and head injuries in people who are older than age 72. Take precautions to prevent a fall at home. Work with your health care provider to learn what changes you can make to improve your health and wellness and to prevent falls. This information is not intended to replace advice given to you by your health care provider. Make sure you discuss any questions you have with your health care provider. Document Revised: 06/07/2020 Document Reviewed: 03/15/2020 Elsevier Patient Education  2022 [employer]  

## 2020-12-27 NOTE — Progress Notes (Signed)
Patient ID: Paige Rasmussen, female  DOB: 22-Sep-1950, 70 y.o.   MRN: [id number] Patient Care Team    Relationship Specialty Notifications Start End  Ma Hillock, DO PCP - General Family Medicine  07/25/20   Leonia Corona, MD Referring Physician Ophthalmology  12/27/20     Chief Complaint  Patient presents with   Establish Care   Hypothyroidism    Pt is fasting   Annual Exam    Subjective:  Paige Rasmussen is a 70 y.o.  female present for new patient establishment/CPE All past medical history, surgical history, allergies, family history, immunizations, medications and social history were updated in the electronic medical record today. All recent labs, ED visits and hospitalizations within the last year were reviewed.  Health maintenance:  Colonoscopy: completed 08/2015, by Dr. Henrene Pastor. follow up 10 yrs. Mammogram: completed:01/2020, [facility] Cervical cancer screening: last pap: n/a  Immunizations: tdap 2012- printed script for her today, Influenza updated today (encouraged yearly), PNA series completed, zostavax printed for her today Infectious disease screening: Hep C - agree to screen today DEXA: last completed 01/2020, result osteopenia, follow needed 52yr Assistive device: none Oxygen uLSL:HTDSPatient has a Dental home. Hospitalizations/ED visits:reviewed  [facility] 12/27/2020 12/13/2019 10/11/2018 12/07/2017 11/23/2017  Decreased Interest 0 0 0 0 0  Down, Depressed, Hopeless 0 0 0 0 0  PHQ - 2 Score 0 0 0 0 0  Altered sleeping - - - - -  Tired, decreased energy - - - - -  Change in appetite - - - - -  Feeling bad or failure about yourself  - - - - -  Trouble concentrating - - - - -  Moving slowly or fidgety/restless - - - - -  Suicidal thoughts - - - - -  PHQ-9 Score - - - - -  Difficult doing work/chores - - - - -   No flowsheet data found.     Fall Risk  12/27/2020 12/13/2019 10/11/2018 12/07/2017 11/23/2017  Falls in the past year? 0 1  0 Yes Yes  Number falls in past yr: 0 0 - 1 1  Injury with Fall? 0 0 - No No  Risk for fall due to : No Fall Risks No Fall Risks - - -  Follow up Falls evaluation completed Falls evaluation completed Falls evaluation completed - -     Immunization History  Administered Date(s) Administered   Fluad Quad(high Dose 65+) 12/07/2018, 01/18/2020, 12/27/2020   Influenza, High Dose Seasonal PF 02/04/2017, 01/25/2018   Influenza,inj,Quad PF,6+ Mos 01/24/2015, 03/03/2016   Influenza-Unspecified 04/10/2011   PFIZER(Purple Top)SARS-COV-2 Vaccination 05/20/2019, 06/10/2019   Pneumococcal Conjugate-13 06/18/2014   Pneumococcal Polysaccharide-23 06/25/2015   Td 04/10/2011   Tdap 04/10/2011   Zoster, Live 12/31/2010    No results found.  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   Colon polyps    Heart murmur    Murmur    Osteopenia    Thyroid disease    UTI (urinary tract infection)    Allergies  Allergen Reactions   Penicillins     REACTION: eyes, lips, and joints swell   Codeine     REACTION: nausea and vomiting   Oxycodone Itching   Past Surgical History:  Procedure Laterality Date   BREAST CYST ASPIRATION     BREAST EXCISIONAL BIOPSY Right    benign   FINGER SURGERY     left ring finger  TONSILLECTOMY     TUBAL LIGATION     WRIST SURGERY     left wrist   Family History  Problem Relation Age of Onset   Arthritis Mother    COPD Mother    Hearing loss Mother    Multiple myeloma Father    Arthritis Sister    Diabetes Sister    Hearing loss Brother    Diabetes Brother    Hearing loss Son    Cancer Paternal Grandfather    Social History   Social History Narrative   Marital status/children/pets: widowed. 1 Son.    Education/employment: 1 yr college. Retired   Engineer, materials:      -smoke alarm in the home:Yes     - wears seatbelt: Yes     - Feels safe in their relationships: Yes       Allergies as of 12/27/2020       Reactions    Penicillins    REACTION: eyes, lips, and joints swell   Codeine    REACTION: nausea and vomiting   Oxycodone Itching        Medication List        Accurate as of December 27, 2020 11:01 AM. If you have any questions, ask your nurse or doctor.          calcium-vitamin D 500-200 MG-UNIT tablet Take 2 tablets by mouth. Take 2 pills daily   glucosamine-chondroitin 500-400 MG tablet Take 1 tablet by mouth daily.   levothyroxine 100 MCG tablet Commonly known as: SYNTHROID Take 1 tablet (100 mcg total) by mouth daily.   Shingrix injection Generic drug: Zoster Vaccine Adjuvanted Inject 0.5 mLs into the muscle once for 1 dose. Then repeat dose in 3 months. Started by: Howard Pouch, DO   Tdap 5-2.5-18.5 LF-MCG/0.5 injection Commonly known as: BOOSTRIX Inject 0.5 mLs into the muscle once for 1 dose. Started by: Howard Pouch, DO   vitamin C 500 MG tablet Commonly known as: ASCORBIC ACID Take 1,000 mg by mouth daily.        All past medical history, surgical history, allergies, family history, immunizations andmedications were updated in the EMR today and reviewed under the history and medication portions of their EMR.    No results found for this or any previous visit (from the past 2160 hour(s)).  DG Bone Density  Result Date: 01/26/2020 EXAM: DUAL X-RAY ABSORPTIOMETRY (DXA) FOR BONE MINERAL DENSITY IMPRESSION: Referring Physician:  Alycia Rossetti Your patient completed a BMD test using Lunar IDXA DXA system ( analysis version: 16 ) manufactured by [employer]. Technologist: AW PATIENT: Name: Paige Rasmussen Patient ID: [id number] Birth Date: 1950-06-13 Height: 65.5 in. Sex: Female Measured: 01/26/2020 Weight: 136.6 lbs. Indications: Advanced Age, Caucasian, Estrogen Deficient, Height Loss (781.91), History of Fracture (Adult) (V15.51), History of Osteopenia, Hx of tobacco use, Hypothyroid, Levothyroxine, Postmenopausal Fractures: Finger Treatments: Calcium (E943.0),  Vitamin D (E933.5) ASSESSMENT: The BMD measured at Femur Neck Left is 0.786 g/cm2 with a T-score of -1.8.  This patient is considered osteopenic ASSESSMENT: The probability of a major osteoporotic fracture is 16.6 % within the next ten years. The probability of a hip fracture is 3.1 % within the next ten years.   MM 3D SCREEN BREAST BILATERAL Result Date: 01/30/2020 IMPRESSION: No mammographic evidence of malignancy. A result letter of this screening mammogram will be mailed directly to the patient. RECOMMENDATION: Screening mammogram in one year. (Code:SM-B-01Y) BI-RADS CATEGORY  1: Negative. Electronically Signed   By: Marin Olp M.D.  On: 01/30/2020 10:34     ROS: 14 pt review of systems performed and negative (unless mentioned in an HPI)  Objective: BP 123/67   Pulse 76   Temp 98.1 F (36.7 C) (Oral)   Ht 5' 5.5" (1.664 m)   Wt 138 lb (62.6 kg)   SpO2 99%   BMI 22.62 kg/m  Gen: Afebrile. No acute distress. Nontoxic in appearance, well-developed, well-nourished,  very pleasant female.  HENT: AT. [place]. Bilateral TM visualized and normal in appearance, normal external auditory canal. MMM, no oral lesions, adequate dentition. Bilateral nares within normal limits. Throat without erythema, ulcerations or exudates. no Cough on exam, no hoarseness on exam. Eyes:Pupils Equal Round Reactive to light, Extraocular movements intact,  Conjunctiva without redness, discharge or icterus. Neck/lymp/endocrine: Supple,no lymphadenopathy, no thyromegaly CV: RRR no murmur, no edema, +2/4 P posterior tibialis pulses.  Chest: CTAB, no wheeze, rhonchi or crackles. Normal  Respiratory effort. good Air movement. Abd: Soft. flat. NTND. BS present. no Masses palpated. No hepatosplenomegaly. No rebound tenderness or guarding. Skin: no rashes, purpura or petechiae. Warm and well-perfused. Skin intact. Neuro/Msk: Normal gait. PERLA. EOMi. Alert. Oriented x3.  Cranial nerves II through XII intact. Muscle strength  5/5 upper/lower extremity. DTRs equal bilaterally. Psych: Normal affect, dress and demeanor. Normal speech. Normal thought content and judgment.   Assessment/plan: Valmai Vandenberghe is a 70 y.o. female present for CPE/new est hypothyroidism Has been stable.  - T3, free - T4, free - TSH - Lipid panel - Continue levothyroxine (SYNTHROID) 100 MCG tablet; Take 1 tablet (100 mcg total) by mouth daily.  Dispense: 90 tablet; Refill: 3> if labs indicate will adjust dose.   Need for influenza vaccination - Flu Vaccine QUAD High Dose(Fluad) Diabetes mellitus screening - Comp Met (CMET) - Hemoglobin A1c Screening for deficiency anemia - CBC w/Diff Breast cancer screening by mammogram - MM 3D SCREEN BREAST BILATERAL; Future Need for hepatitis C screening test - Hepatitis C Antibody Osteopenia of multiple sites F/u dexa due 01/2023 Continue vit d/ca Routine general medical examination at a health care facility Colonoscopy: completed 08/2015, by Dr. Henrene Pastor. follow up 10 yrs. Mammogram: completed:01/2020, [facility] Cervical cancer screening: last pap: n/a  Immunizations: tdap 2012- printed script for her today, Influenza updated today (encouraged yearly), PNA series completed, zostavax printed for her today Infectious disease screening: Hep C - agree to screen today DEXA: last completed 01/2020, result osteopenia, follow needed 63yr Patient was encouraged to exercise greater than 150 minutes a week. Patient was encouraged to choose a diet filled with fresh fruits and vegetables, and lean meats. AVS provided to patient today for education/recommendation on gender specific health and safety maintenance.  Return in about 1 year (around 12/29/2021) for CPE (30 min), [facility] (30 min).  Orders Placed This Encounter  Procedures   MM 3D SCREEN BREAST BILATERAL   Flu Vaccine QUAD High Dose(Fluad)   T3, free   T4, free   TSH   CBC w/Diff   Comp Met (CMET)   Lipid panel   Hemoglobin A1c    Hepatitis C Antibody   Meds ordered this encounter  Medications   Tdap (BOOSTRIX) 5-2.5-18.5 LF-MCG/0.5 injection    Sig: Inject 0.5 mLs into the muscle once for 1 dose.    Dispense:  0.5 mL    Refill:  0   Zoster Vaccine Adjuvanted ([facility] injection    Sig: Inject 0.5 mLs into the muscle once for 1 dose. Then repeat dose in 3 months.    Dispense:  0.5 mL    Refill:  1   levothyroxine (SYNTHROID) 100 MCG tablet    Sig: Take 1 tablet (100 mcg total) by mouth daily.    Dispense:  90 tablet    Refill:  3   Referral Orders  No referral(s) requested today     Note is dictated utilizing voice recognition software. Although note has been proof read prior to signing, occasional typographical errors still can be missed. If any questions arise, please do not hesitate to call for verification.  Electronically signed by: Howard Pouch, DO [place]

## 2020-12-30 LAB — HEPATITIS C ANTIBODY
Hepatitis C Ab: NONREACTIVE
SIGNAL TO CUT-OFF: 0.01 (ref <1.00)

## 2021-01-01 ENCOUNTER — Ambulatory Visit (INDEPENDENT_AMBULATORY_CARE_PROVIDER_SITE_OTHER): Payer: PPO | Admitting: *Deleted

## 2021-01-01 DIAGNOSIS — Z0000 Encounter for general adult medical examination without abnormal findings: Secondary | ICD-10-CM

## 2021-01-01 NOTE — Patient Instructions (Signed)
Paige Rasmussen , Thank you for taking time to come for your Medicare Wellness Visit. I appreciate your ongoing commitment to your health goals. Please review the following plan we discussed and let me know if I can assist you in the future.   Screening recommendations/referrals: Colonoscopy: up to date Mammogram: up to date Bone Density: up to date Recommended yearly ophthalmology/optometry visit for glaucoma screening and checkup Recommended yearly dental visit for hygiene and checkup  Vaccinations: Influenza vaccine: up to date Pneumococcal vaccine: up to date Tdap vaccine: Education provided Shingles vaccine: Education provided    Advanced directives: copy requested   Conditions/risks identified:   Next appointment: 12-29-2021 @ 8:30  Dr. Raoul Pitch   Preventive Care 72 Years and Older, Female Preventive care refers to lifestyle choices and visits with your health care provider that can promote health and wellness. What does preventive care include? A yearly physical exam. This is also called an annual well check. Dental exams once or twice a year. Routine eye exams. Ask your health care provider how often you should have your eyes checked. Personal lifestyle choices, including: Daily care of your teeth and gums. Regular physical activity. Eating a healthy diet. Avoiding tobacco and drug use. Limiting alcohol use. Practicing safe sex. Taking low-dose aspirin every day. Taking vitamin and mineral supplements as recommended by your health care provider. What happens during an annual well check? The services and screenings done by your health care provider during your annual well check will depend on your age, overall health, lifestyle risk factors, and family history of disease. Counseling  Your health care provider may ask you questions about your: Alcohol use. Tobacco use. Drug use. Emotional well-being. Home and relationship well-being. Sexual activity. Eating  habits. History of falls. Memory and ability to understand (cognition). Work and work Statistician. Reproductive health. Screening  You may have the following tests or measurements: Height, weight, and BMI. Blood pressure. Lipid and cholesterol levels. These may be checked every 5 years, or more frequently if you are over 25 years old. Skin check. Lung cancer screening. You may have this screening every year starting at age 22 if you have a 30-pack-year history of smoking and currently smoke or have quit within the past 15 years. Fecal occult blood test (FOBT) of the stool. You may have this test every year starting at age 78. Flexible sigmoidoscopy or colonoscopy. You may have a sigmoidoscopy every 5 years or a colonoscopy every 10 years starting at age 20. Hepatitis C blood test. Hepatitis B blood test. Sexually transmitted disease (STD) testing. Diabetes screening. This is done by checking your blood sugar (glucose) after you have not eaten for a while (fasting). You may have this done every 1-3 years. Bone density scan. This is done to screen for osteoporosis. You may have this done starting at age 26. Mammogram. This may be done every 1-2 years. Talk to your health care provider about how often you should have regular mammograms. Talk with your health care provider about your test results, treatment options, and if necessary, the need for more tests. Vaccines  Your health care provider may recommend certain vaccines, such as: Influenza vaccine. This is recommended every year. Tetanus, diphtheria, and acellular pertussis (Tdap, Td) vaccine. You may need a Td booster every 10 years. Zoster vaccine. You may need this after age 109. Pneumococcal 13-valent conjugate (PCV13) vaccine. One dose is recommended after age 16. Pneumococcal polysaccharide (PPSV23) vaccine. One dose is recommended after age 40. Talk to your health  care provider about which screenings and vaccines you need and how  often you need them. This information is not intended to replace advice given to you by your health care provider. Make sure you discuss any questions you have with your health care provider. Document Released: 04/26/2015 Document Revised: 12/18/2015 Document Reviewed: 01/29/2015 Elsevier Interactive Patient Education  2017 [place] Prevention in the Home Falls can cause injuries. They can happen to people of all ages. There are many things you can do to make your home safe and to help prevent falls. What can I do on the outside of my home? Regularly fix the edges of walkways and driveways and fix any cracks. Remove anything that might make you trip as you walk through a door, such as a raised step or threshold. Trim any bushes or trees on the path to your home. Use bright outdoor lighting. Clear any walking paths of anything that might make someone trip, such as rocks or tools. Regularly check to see if handrails are loose or broken. Make sure that both sides of any steps have handrails. Any raised decks and porches should have guardrails on the edges. Have any leaves, snow, or ice cleared regularly. Use sand or salt on walking paths during winter. Clean up any spills in your garage right away. This includes oil or grease spills. What can I do in the bathroom? Use night lights. Install grab bars by the toilet and in the tub and shower. Do not use towel bars as grab bars. Use non-skid mats or decals in the tub or shower. If you need to sit down in the shower, use a plastic, non-slip stool. Keep the floor dry. Clean up any water that spills on the floor as soon as it happens. Remove soap buildup in the tub or shower regularly. Attach bath mats securely with double-sided non-slip rug tape. Do not have throw rugs and other things on the floor that can make you trip. What can I do in the bedroom? Use night lights. Make sure that you have a light by your bed that is easy to  reach. Do not use any sheets or blankets that are too big for your bed. They should not hang down onto the floor. Have a firm chair that has side arms. You can use this for support while you get dressed. Do not have throw rugs and other things on the floor that can make you trip. What can I do in the kitchen? Clean up any spills right away. Avoid walking on wet floors. Keep items that you use a lot in easy-to-reach places. If you need to reach something above you, use a strong step stool that has a grab bar. Keep electrical cords out of the way. Do not use floor polish or wax that makes floors slippery. If you must use wax, use non-skid floor wax. Do not have throw rugs and other things on the floor that can make you trip. What can I do with my stairs? Do not leave any items on the stairs. Make sure that there are handrails on both sides of the stairs and use them. Fix handrails that are broken or loose. Make sure that handrails are as long as the stairways. Check any carpeting to make sure that it is firmly attached to the stairs. Fix any carpet that is loose or worn. Avoid having throw rugs at the top or bottom of the stairs. If you do have throw rugs, attach them to  the floor with carpet tape. Make sure that you have a light switch at the top of the stairs and the bottom of the stairs. If you do not have them, ask someone to add them for you. What else can I do to help prevent falls? Wear shoes that: Do not have high heels. Have rubber bottoms. Are comfortable and fit you well. Are closed at the toe. Do not wear sandals. If you use a stepladder: Make sure that it is fully opened. Do not climb a closed stepladder. Make sure that both sides of the stepladder are locked into place. Ask someone to hold it for you, if possible. Clearly mark and make sure that you can see: Any grab bars or handrails. First and last steps. Where the edge of each step is. Use tools that help you move  around (mobility aids) if they are needed. These include: Canes. Walkers. Scooters. Crutches. Turn on the lights when you go into a dark area. Replace any light bulbs as soon as they burn out. Set up your furniture so you have a clear path. Avoid moving your furniture around. If any of your floors are uneven, fix them. If there are any pets around you, be aware of where they are. Review your medicines with your doctor. Some medicines can make you feel dizzy. This can increase your chance of falling. Ask your doctor what other things that you can do to help prevent falls. This information is not intended to replace advice given to you by your health care provider. Make sure you discuss any questions you have with your health care provider. Document Released: 01/24/2009 Document Revised: 09/05/2015 Document Reviewed: 05/04/2014 Elsevier Interactive Patient Education  2017 [employer].

## 2021-01-01 NOTE — Progress Notes (Signed)
Subjective:   Paige Rasmussen is a 70 y.o. female who presents for Medicare Annual (Subsequent) preventive examination.  I connected with  Paige Rasmussen on 01/01/21 by a telephone enabled telemedicine application and verified that I am speaking with the correct person using two identifiers.   I discussed the limitations of evaluation and management by telemedicine. The patient expressed understanding and agreed to proceed.   Review of Systems     Cardiac Risk Factors include: advanced age (>23mn, >>60women)     Objective:    Today's Vitals   There is no height or weight on file to calculate BMI.  Advanced Directives 01/01/2021 12/13/2019 10/21/2018 10/11/2018 12/11/2017 06/29/2016  Does Patient Have a Medical Advance Directive? Yes Yes Yes Yes No Yes  Type of [place] will Living will Living will;Healthcare Power of Attorney - -  Does patient want to make changes to medical advance directive? - No - Patient declined - No - Patient declined - No - Patient declined  Copy of HCambriain Chart? No - copy requested No - copy requested - No - copy requested - -    Current Medications (verified) Outpatient Encounter Medications as of 01/01/2021  Medication Sig   Calcium Carbonate-Vitamin D (CALCIUM-VITAMIN D) 500-200 MG-UNIT per tablet Take 2 tablets by mouth. Take 2 pills daily   glucosamine-chondroitin 500-400 MG tablet Take 1 tablet by mouth daily.    levothyroxine (SYNTHROID) 100 MCG tablet Take 1 tablet (100 mcg total) by mouth daily.   vitamin C (ASCORBIC ACID) 500 MG tablet Take 1,000 mg by mouth daily.    No facility-administered encounter medications on file as of 01/01/2021.    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   Colon polyps    Heart murmur    Murmur    Osteopenia     Thyroid disease    UTI (urinary tract infection)    Past Surgical History:  Procedure Laterality Date   BREAST CYST ASPIRATION     BREAST EXCISIONAL BIOPSY Right    benign   FINGER SURGERY     left ring finger   TONSILLECTOMY     TUBAL LIGATION     WRIST SURGERY     left wrist   Family History  Problem Relation Age of Onset   Arthritis Mother    COPD Mother    Hearing loss Mother    Multiple myeloma Father    Arthritis Sister    Diabetes Sister    Hearing loss Brother    Diabetes Brother    Hearing loss Son    Cancer Paternal Grandfather    Social History   Socioeconomic History   Marital status: Widowed    Spouse name: Not on file   Number of children: Not on file   Years of education: Not on file   Highest education level: Not on file  Occupational History   Not on file  Tobacco Use   Smoking status: Former    Types: Cigarettes    Quit date: 11/30/1982    Years since quitting: 38.1    Passive exposure: Never   Smokeless tobacco: Never  Vaping Use   Vaping Use: Never used  Substance and Sexual Activity   Alcohol use: Yes    Comment: socially   Drug use: Never   Sexual activity:  Not Currently  Other Topics Concern   Not on file  Social History Narrative   Marital status/children/pets: widowed. 1 Son.    Education/employment: 1 yr college. Retired   Engineer, materials:      -smoke alarm in the home:Yes     - wears seatbelt: Yes     - Feels safe in their relationships: Yes      Social Determinants of Radio broadcast assistant Strain: Low Risk    Difficulty of Paying Living Expenses: Not hard at all  Food Insecurity: No Food Insecurity   Worried About Charity fundraiser in the Last Year: Never true   [place] in the Last Year: Never true  Transportation Needs: No Transportation Needs   Lack of Transportation (Medical): No   Lack of Transportation (Non-Medical): No  Physical Activity: Insufficiently Active   Days of Exercise per Week: 2 days    Minutes of Exercise per Session: 30 min  Stress: No Stress Concern Present   Feeling of Stress : Not at all  Social Connections: Moderately Integrated   Frequency of Communication with Friends and Family: More than three times a week   Frequency of Social Gatherings with Friends and Family: More than three times a week   Attends Religious Services: More than 4 times per year   Active Member of [employer] or Organizations: Yes   Attends Archivist Meetings: More than 4 times per year   Marital Status: Widowed    Tobacco Counseling Counseling given: Not Answered   Clinical Intake:  Pre-visit preparation completed: Yes  Pain : No/denies pain     Nutritional Risks: None Diabetes: No  How often do you need to have someone help you when you read instructions, pamphlets, or other written materials from your doctor or pharmacy?: 1 - Never  Diabetic?  no  Interpreter Needed?: No  Information entered by :: Leroy Kennedy LPN   Activities of Daily Living In your present state of health, do you have any difficulty performing the following activities: 01/01/2021  Hearing? N  Vision? N  Difficulty concentrating or making decisions? N  Walking or climbing stairs? N  Dressing or bathing? N  Doing errands, shopping? N  Preparing Food and eating ? N  Using the Toilet? N  In the past six months, have you accidently leaked urine? N  Do you have problems with loss of bowel control? N  Managing your Medications? N  Managing your Finances? N  Housekeeping or managing your Housekeeping? N  Some recent data might be hidden    Patient Care Team: Ma Hillock, DO as PCP - General (Family Medicine) Leonia Corona, MD as Referring Physician (Ophthalmology)  Indicate any recent Medical Services you may have received from other than [facility] providers in the past year (date may be approximate).     Assessment:   This is a routine wellness examination for Paige Rasmussen.  Hearing/Vision  screen Hearing Screening - Comments:: No trouble hearing Vision Screening - Comments:: Dr. Lanelle Bal  Dietary issues and exercise activities discussed: Current Exercise Habits: Home exercise routine, Type of exercise: walking (yard work), Time (Minutes): 20, Frequency (Times/Week): 3, Weekly Exercise (Minutes/Week): 60, Intensity: Mild   Goals Addressed             This Visit's Progress    Patient Stated       Continue current lifestyle        Depression Screen [facility] 2/9 Scores 01/01/2021 12/27/2020 12/13/2019 10/11/2018  12/07/2017 11/23/2017 07/02/2017  PHQ - 2 Score 0 0 0 0 0 0 1  PHQ- 9 Score - - - - - - 2    Fall Risk Fall Risk  01/01/2021 12/27/2020 12/13/2019 10/11/2018 12/07/2017  Falls in the past year? 0 0 1 0 Yes  Number falls in past yr: 0 0 0 - 1  Injury with Fall? 0 0 0 - No  Risk for fall due to : - No Fall Risks No Fall Risks - -  Follow up Falls evaluation completed;Falls prevention discussed Falls evaluation completed Falls evaluation completed Falls evaluation completed -    FALL RISK PREVENTION PERTAINING TO THE HOME:  Any stairs in or around the home? Yes  If so, are there any without handrails? No  Home free of loose throw rugs in walkways, pet beds, electrical cords, etc? Yes  Adequate lighting in your home to reduce risk of falls? Yes   ASSISTIVE DEVICES UTILIZED TO PREVENT FALLS:  Life alert? No  Use of a cane, walker or w/c? No  Grab bars in the bathroom? No  Shower chair or bench in shower? Yes  Elevated toilet seat or a handicapped toilet? Yes   TIMED UP AND GO:  Was the test performed? No .    Cognitive Function:        Immunizations Immunization History  Administered Date(s) Administered   Fluad Quad(high Dose 65+) 12/07/2018, 01/18/2020, 12/27/2020   Influenza, High Dose Seasonal PF 02/04/2017, 01/25/2018   Influenza,inj,Quad PF,6+ Mos 01/24/2015, 03/03/2016   Influenza-Unspecified 04/10/2011   PFIZER(Purple Top)SARS-COV-2  Vaccination 05/20/2019, 06/10/2019   Pneumococcal Conjugate-13 06/18/2014   Pneumococcal Polysaccharide-23 06/25/2015   Td 04/10/2011   Tdap 04/10/2011   Zoster, Live 12/31/2010    TDAP status: Up to date  Flu Vaccine status: Up to date  Pneumococcal vaccine status: Up to date  Covid-19 vaccine status: Information provided on how to obtain vaccines.   Qualifies for Shingles Vaccine? Yes   Zostavax completed Yes   Shingrix Completed?: No.    Education has been provided regarding the importance of this vaccine. Patient has been advised to call insurance company to determine out of pocket expense if they have not yet received this vaccine. Advised may also receive vaccine at local pharmacy or Health Dept. Verbalized acceptance and understanding.  Screening Tests Health Maintenance  Topic Date Due   COVID-19 Vaccine (3 - Pfizer risk series) 01/12/2021 (Originally 07/08/2019)   Zoster Vaccines- Shingrix (1 of 2) 03/28/2021 (Originally 02/13/1970)   TETANUS/TDAP  04/09/2021   MAMMOGRAM  01/25/2022   DEXA SCAN  01/26/2023   COLONOSCOPY (Pts 45-43yr Insurance coverage will need to be confirmed)  09/02/2025   INFLUENZA VACCINE  Completed   Hepatitis C Screening  Completed   HPV VACCINES  Aged Out    Health Maintenance  There are no preventive care reminders to display for this patient.  Colorectal cancer screening: Type of screening: Colonoscopy. Completed 2017. Repeat every 10 years  Mammogram status: Completed  . Repeat every year  Bone Density status: Completed  . Results reflect: Bone density results: OSTEOPENIA. Repeat every 2 years.  Lung Cancer Screening: (Low Dose CT Chest recommended if Age 72-80years, 30 pack-year currently smoking OR have quit w/in 15years.) does not qualify.   Lung Cancer Screening Referral:   Additional Screening:  Hepatitis C Screening: does not qualify; Completed   Vision Screening: Recommended annual ophthalmology exams for early detection  of glaucoma and other disorders of the eye. Is the  patient up to date with their annual eye exam?  Yes  Who is the provider or what is the name of the office in which the patient attends annual eye exams? Dr. Ludwig Lean If pt is not established with a provider, would they like to be referred to a provider to establish care? No .   Dental Screening: Recommended annual dental exams for proper oral hygiene  Community Resource Referral / Chronic Care Management: CRR required this visit?  No   CCM required this visit?  No      Plan:     I have personally reviewed and noted the following in the patient's chart:   Medical and social history Use of alcohol, tobacco or illicit drugs  Current medications and supplements including opioid prescriptions.  Functional ability and status Nutritional status Physical activity Advanced directives List of other physicians Hospitalizations, surgeries, and ER visits in previous 12 months Vitals Screenings to include cognitive, depression, and falls Referrals and appointments  In addition, I have reviewed and discussed with patient certain preventive protocols, quality metrics, and best practice recommendations. A written personalized care plan for preventive services as well as general preventive health recommendations were provided to patient.     Leroy Kennedy, LPN   12/31/1005   Nurse Notes:

## 2021-03-31 ENCOUNTER — Ambulatory Visit: Payer: PPO

## 2021-04-02 ENCOUNTER — Ambulatory Visit
Admission: RE | Admit: 2021-04-02 | Discharge: 2021-04-02 | Disposition: A | Discharge status: Home / Self Care | Payer: PPO | Source: Ambulatory Visit | Attending: Family Medicine | Admitting: Family Medicine

## 2021-04-02 DIAGNOSIS — Z1231 Encounter for screening mammogram for malignant neoplasm of breast: Secondary | ICD-10-CM

## 2021-04-03 ENCOUNTER — Other Ambulatory Visit: Payer: Self-pay | Admitting: Family Medicine

## 2021-04-03 DIAGNOSIS — R928 Other abnormal and inconclusive findings on diagnostic imaging of breast: Secondary | ICD-10-CM

## 2021-04-04 ENCOUNTER — Ambulatory Visit (INDEPENDENT_AMBULATORY_CARE_PROVIDER_SITE_OTHER): Payer: Self-pay | Admitting: Plastic Surgery

## 2021-04-04 ENCOUNTER — Other Ambulatory Visit: Payer: Self-pay

## 2021-04-04 ENCOUNTER — Encounter: Payer: Self-pay | Admitting: Plastic Surgery

## 2021-04-04 DIAGNOSIS — Z719 Counseling, unspecified: Secondary | ICD-10-CM

## 2021-04-04 NOTE — Progress Notes (Signed)

## 2021-06-03 DIAGNOSIS — Z23 Encounter for immunization: Secondary | ICD-10-CM | POA: Diagnosis not present

## 2021-06-04 ENCOUNTER — Other Ambulatory Visit: Payer: Self-pay | Admitting: Family Medicine

## 2021-06-04 ENCOUNTER — Ambulatory Visit
Admission: RE | Admit: 2021-06-04 | Discharge: 2021-06-04 | Disposition: A | Discharge status: Home / Self Care | Payer: PPO | Source: Ambulatory Visit | Attending: Family Medicine | Admitting: Family Medicine

## 2021-06-04 DIAGNOSIS — R922 Inconclusive mammogram: Secondary | ICD-10-CM | POA: Diagnosis not present

## 2021-06-04 DIAGNOSIS — R928 Other abnormal and inconclusive findings on diagnostic imaging of breast: Secondary | ICD-10-CM

## 2021-06-04 DIAGNOSIS — Z719 Counseling, unspecified: Secondary | ICD-10-CM

## 2021-06-04 DIAGNOSIS — N6001 Solitary cyst of right breast: Secondary | ICD-10-CM

## 2021-08-01 ENCOUNTER — Ambulatory Visit (INDEPENDENT_AMBULATORY_CARE_PROVIDER_SITE_OTHER): Payer: Self-pay | Admitting: Plastic Surgery

## 2021-08-01 ENCOUNTER — Encounter: Payer: Self-pay | Admitting: Plastic Surgery

## 2021-08-01 DIAGNOSIS — Z719 Counseling, unspecified: Secondary | ICD-10-CM

## 2021-08-01 NOTE — Progress Notes (Signed)
Botulinum Toxin Procedure Note ? ?Procedure: Cosmetic botulinum toxin  ? ?Pre-operative Diagnosis: Dynamic rhytides  ? ?Post-operative Diagnosis: Same ? ?Complications:  None ? ?Brief history: The patient desires botulinum toxin injection of her forehead. I discussed with the patient this proposed procedure of botulinum toxin injections, which is customized depending on the particular needs of the patient. It is performed on facial rhytids as a temporary correction. The alternatives were discussed with the patient. The risks were addressed including bleeding, scarring, infection, damage to deeper structures, asymmetry, and chronic pain, which may occur infrequently after a procedure. The individual's choice to undergo a surgical procedure is based on the comparison of risks to potential benefits. Other risks include unsatisfactory results, brow ptosis, eyelid ptosis, allergic reaction, temporary paralysis, which should go away with time, bruising, blurring disturbances and delayed healing. Botulinum toxin injections do not arrest the aging process or produce permanent tightening of the eyelid.  Operative intervention maybe necessary to maintain the results of a blepharoplasty or botulinum toxin. The patient understands and wishes to proceed. ? ?Procedure: The area was prepped with alcohol and dried with a clean gauze. Using a clean technique, the botulinum toxin was diluted with 1.25 cc of preservative-free normal saline which was slowly injected with an 18 gauge needle in a tuberculin syringes.  A 32 gauge needles were then used to inject the botulinum toxin. This mixture allow for an aliquot of 4 units per 0.1 cc in each injection site.   ? ?Subsequently the mixture was injected in the glabellar and forehead area with preservation of the temporal branch to the lateral eyebrow as well as into each lateral canthal area beginning from the lateral orbital rim medial to the zygomaticus major in 3 separate areas. A  total of 8 Units of botulinum toxin was used. The forehead and glabellar area was injected with care to inject intramuscular only while holding pressure on the supratrochlear vessels in each area during each injection on either side of the medial corrugators. The injection proceeded vertically superiorly to the medial 2/3 of the frontalis muscle and superior 2/3 of the lateral frontalis, again with preservation of the frontal branch. No complications were noted. Light pressure was held for 5 minutes. She was instructed explicitly in post-operative care. ? ?Botox ?LOT:  [id number] ?EXP:  25/04 ?

## 2021-11-06 DIAGNOSIS — Z719 Counseling, unspecified: Secondary | ICD-10-CM

## 2021-11-28 DIAGNOSIS — L988 Other specified disorders of the skin and subcutaneous tissue: Secondary | ICD-10-CM | POA: Diagnosis not present

## 2021-11-28 DIAGNOSIS — W5503XA Scratched by cat, initial encounter: Secondary | ICD-10-CM | POA: Diagnosis not present

## 2021-12-01 DIAGNOSIS — Z719 Counseling, unspecified: Secondary | ICD-10-CM

## 2021-12-04 ENCOUNTER — Other Ambulatory Visit: Payer: PPO

## 2021-12-17 ENCOUNTER — Other Ambulatory Visit: Payer: Self-pay | Admitting: Family Medicine

## 2021-12-17 DIAGNOSIS — E038 Other specified hypothyroidism: Secondary | ICD-10-CM

## 2021-12-25 ENCOUNTER — Other Ambulatory Visit: Payer: PPO

## 2021-12-26 ENCOUNTER — Encounter: Payer: Self-pay | Admitting: Plastic Surgery

## 2021-12-26 ENCOUNTER — Ambulatory Visit
Admission: RE | Admit: 2021-12-26 | Discharge: 2021-12-26 | Disposition: A | Discharge status: Home / Self Care | Payer: PPO | Source: Ambulatory Visit | Attending: Family Medicine | Admitting: Family Medicine

## 2021-12-26 ENCOUNTER — Ambulatory Visit (INDEPENDENT_AMBULATORY_CARE_PROVIDER_SITE_OTHER): Payer: Self-pay | Admitting: Plastic Surgery

## 2021-12-26 ENCOUNTER — Other Ambulatory Visit: Payer: Self-pay | Admitting: Family Medicine

## 2021-12-26 DIAGNOSIS — N6001 Solitary cyst of right breast: Secondary | ICD-10-CM

## 2021-12-26 DIAGNOSIS — N6311 Unspecified lump in the right breast, upper outer quadrant: Secondary | ICD-10-CM | POA: Diagnosis not present

## 2021-12-26 DIAGNOSIS — Z719 Counseling, unspecified: Secondary | ICD-10-CM

## 2021-12-26 NOTE — Progress Notes (Signed)

## 2021-12-29 ENCOUNTER — Encounter: Payer: Self-pay | Admitting: Family Medicine

## 2021-12-29 ENCOUNTER — Ambulatory Visit (INDEPENDENT_AMBULATORY_CARE_PROVIDER_SITE_OTHER): Payer: PPO | Admitting: Family Medicine

## 2021-12-29 VITALS — BP 131/76 | HR 74 | Temp 97.7°F | Ht 66.0 in | Wt 143.0 lb

## 2021-12-29 DIAGNOSIS — Z79899 Other long term (current) drug therapy: Secondary | ICD-10-CM | POA: Diagnosis not present

## 2021-12-29 DIAGNOSIS — Z23 Encounter for immunization: Secondary | ICD-10-CM

## 2021-12-29 DIAGNOSIS — E034 Atrophy of thyroid (acquired): Secondary | ICD-10-CM | POA: Diagnosis not present

## 2021-12-29 DIAGNOSIS — E038 Other specified hypothyroidism: Secondary | ICD-10-CM

## 2021-12-29 DIAGNOSIS — Z0000 Encounter for general adult medical examination without abnormal findings: Secondary | ICD-10-CM | POA: Diagnosis not present

## 2021-12-29 DIAGNOSIS — M8589 Other specified disorders of bone density and structure, multiple sites: Secondary | ICD-10-CM

## 2021-12-29 LAB — COMPREHENSIVE METABOLIC PANEL
ALT: 12 U/L (ref 0–35)
AST: 18 U/L (ref 0–37)
Albumin: 4.2 g/dL (ref 3.5–5.2)
Alkaline Phosphatase: 64 U/L (ref 39–117)
BUN: 13 mg/dL (ref 6–23)
CO2: 30 mEq/L (ref 19–32)
Calcium: 9.7 mg/dL (ref 8.4–10.5)
Chloride: 101 mEq/L (ref 96–112)
Creatinine, Ser: 0.72 mg/dL (ref 0.40–1.20)
GFR: 83.26 mL/min (ref >60.00)
Glucose, Bld: 101 mg/dL — ABNORMAL HIGH (ref 70–99)
Potassium: 4.5 mEq/L (ref 3.5–5.1)
Sodium: 140 mEq/L (ref 135–145)
Total Bilirubin: 0.5 mg/dL (ref 0.2–1.2)
Total Protein: 7.3 g/dL (ref 6.0–8.3)

## 2021-12-29 LAB — HEMOGLOBIN A1C: Hgb A1c MFr Bld: 5.8 % (ref 4.6–6.5)

## 2021-12-29 LAB — CBC
HCT: 37.9 % (ref 36.0–46.0)
Hemoglobin: 12.5 g/dL (ref 12.0–15.0)
MCHC: 33.1 g/dL (ref 30.0–36.0)
MCV: 92.5 fl (ref 78.0–100.0)
Platelets: 258 10*3/uL (ref 150.0–400.0)
RBC: 4.09 Mil/uL (ref 3.87–5.11)
RDW: 13.2 % (ref 11.5–15.5)
WBC: 5 10*3/uL (ref 4.0–10.5)

## 2021-12-29 LAB — LIPID PANEL
Cholesterol: 199 mg/dL (ref 0–200)
HDL: 74.2 mg/dL (ref >39.00)
LDL Cholesterol: 107 mg/dL — ABNORMAL HIGH (ref 0–99)
NonHDL: 124.55
Total CHOL/HDL Ratio: 3
Triglycerides: 90 mg/dL (ref 0.0–149.0)
VLDL: 18 mg/dL (ref 0.0–40.0)

## 2021-12-29 LAB — T4, FREE: Free T4: 1.28 ng/dL (ref 0.60–1.60)

## 2021-12-29 LAB — VITAMIN D 25 HYDROXY (VIT D DEFICIENCY, FRACTURES): VITD: 31.73 ng/mL (ref 30.00–100.00)

## 2021-12-29 LAB — TSH: TSH: 3.57 u[IU]/mL (ref 0.35–5.50)

## 2021-12-29 MED ORDER — LEVOTHYROXINE SODIUM 100 MCG PO TABS: 100.0000 ug | ORAL_TABLET | Freq: Every day | ORAL | 0 refills | Status: DC

## 2021-12-29 NOTE — Patient Instructions (Addendum)
Return in about 1 year (around 12/30/2022) for cpe (20 min), Routine chronic condition follow-up.        Great to see you today.  I have refilled the medication(s) we provide.   If labs were collected, we will inform you of lab results once received either by echart message or telephone call.   - echart message- for normal results that have been seen by the patient already.   - telephone call: abnormal results or if patient has not viewed results in their echart.  Health Maintenance After Age 71 After age 63, you are at a higher risk for certain long-term diseases and infections as well as injuries from falls. Falls are a major cause of broken bones and head injuries in people who are older than age 74. Getting regular preventive care can help to keep you healthy and well. Preventive care includes getting regular testing and making lifestyle changes as recommended by your health care provider. Talk with your health care provider about: Which screenings and tests you should have. A screening is a test that checks for a disease when you have no symptoms. A diet and exercise plan that is right for you. What should I know about screenings and tests to prevent falls? Screening and testing are the best ways to find a health problem early. Early diagnosis and treatment give you the best chance of managing medical conditions that are common after age 42. Certain conditions and lifestyle choices may make you more likely to have a fall. Your health care provider may recommend: Regular vision checks. Poor vision and conditions such as cataracts can make you more likely to have a fall. If you wear glasses, make sure to get your prescription updated if your vision changes. Medicine review. Work with your health care provider to regularly review all of the medicines you are taking, including over-the-counter medicines. Ask your health care provider about any side effects that may make you more likely to have  a fall. Tell your health care provider if any medicines that you take make you feel dizzy or sleepy. Strength and balance checks. Your health care provider may recommend certain tests to check your strength and balance while standing, walking, or changing positions. Foot health exam. Foot pain and numbness, as well as not wearing proper footwear, can make you more likely to have a fall. Screenings, including: Osteoporosis screening. Osteoporosis is a condition that causes the bones to get weaker and break more easily. Blood pressure screening. Blood pressure changes and medicines to control blood pressure can make you feel dizzy. Depression screening. You may be more likely to have a fall if you have a fear of falling, feel depressed, or feel unable to do activities that you used to do. Alcohol use screening. Using too much alcohol can affect your balance and may make you more likely to have a fall. Follow these instructions at home: Lifestyle Do not drink alcohol if: Your health care provider tells you not to drink. If you drink alcohol: Limit how much you have to: 0-1 drink a day for women. 0-2 drinks a day for men. Know how much alcohol is in your drink. In the [place] one drink equals one 12 oz bottle of beer (355 mL), one 5 oz glass of wine (148 mL), or one 1 oz glass of hard liquor (44 mL). Do not use any products that contain nicotine or tobacco. These products include cigarettes, chewing tobacco, and vaping devices, such as e-cigarettes. If you  need help quitting, ask your health care provider. Activity  Follow a regular exercise program to stay fit. This will help you maintain your balance. Ask your health care provider what types of exercise are appropriate for you. If you need a cane or walker, use it as recommended by your health care provider. Wear supportive shoes that have nonskid soles. Safety  Remove any tripping hazards, such as rugs, cords, and clutter. Install safety  equipment such as grab bars in bathrooms and safety rails on stairs. Keep rooms and walkways well-lit. General instructions Talk with your health care provider about your risks for falling. Tell your health care provider if: You fall. Be sure to tell your health care provider about all falls, even ones that seem minor. You feel dizzy, tiredness (fatigue), or off-balance. Take over-the-counter and prescription medicines only as told by your health care provider. These include supplements. Eat a healthy diet and maintain a healthy weight. A healthy diet includes low-fat dairy products, low-fat (lean) meats, and fiber from whole grains, beans, and lots of fruits and vegetables. Stay current with your vaccines. Schedule regular health, dental, and eye exams. Summary Having a healthy lifestyle and getting preventive care can help to protect your health and wellness after age 27. Screening and testing are the best way to find a health problem early and help you avoid having a fall. Early diagnosis and treatment give you the best chance for managing medical conditions that are more common for people who are older than age 72. Falls are a major cause of broken bones and head injuries in people who are older than age 40. Take precautions to prevent a fall at home. Work with your health care provider to learn what changes you can make to improve your health and wellness and to prevent falls. This information is not intended to replace advice given to you by your health care provider. Make sure you discuss any questions you have with your health care provider. Document Revised: 08/19/2020 Document Reviewed: 08/19/2020 Elsevier Patient Education  [place].

## 2021-12-29 NOTE — Progress Notes (Signed)
Patient ID: Paige Rasmussen, female  DOB: [birth year], 71 y.o.   MRN: [id number] Patient Care Team    Relationship Specialty Notifications Start End  Ma Hillock, DO PCP - General Family Medicine  07/25/20   Leonia Corona, MD Referring Physician Ophthalmology  12/27/20     Chief Complaint  Patient presents with   Annual Exam    Pt is fasting    Subjective:  Paige Rasmussen is a 71 y.o.  Female  present for CPE/cmc All past medical history, surgical history, allergies, family history, immunizations, medications and social history were updated in the electronic medical record today. All recent labs, ED visits and hospitalizations within the last year were reviewed.  Health maintenance:  Colonoscopy: completed 08/2015, by Dr. Henrene Pastor. follow up 10 yrs. Mammogram: completed: 05/15/2021, [facility] has order for dx/Us in 03/2022 for probably benign right abnormal mam.  Cervical cancer screening: last pap: n/a  Immunizations: tdap UTD 2023, Influenza wants to wait a few more weeks. (encouraged yearly), PNA series completed, shingrix series completed Infectious disease screening: Hep C  completed DEXA: last completed 01/2020, result osteopenia, follow needed 88yr Assistive device: none Oxygen uUKG:URKYPatient has a Dental home. Hospitalizations/ED visits: reviewed     12/29/2021    8:30 AM 01/01/2021   11:46 AM 12/27/2020    8:22 AM 12/13/2019    9:32 AM 10/11/2018   10:24 AM  Depression screen PHQ 2/9  Decreased Interest 0 0 0 0 0  Down, Depressed, Hopeless 0 0 0 0 0  PHQ - 2 Score 0 0 0 0 0       No data to display           Immunization History  Administered Date(s) Administered   Fluad Quad(high Dose 65+) 12/07/2018, 01/18/2020, 12/27/2020   Influenza, High Dose Seasonal PF 02/04/2017, 01/25/2018   Influenza,inj,Quad PF,6+ Mos 01/24/2015, 03/03/2016   Influenza-Unspecified 04/10/2011   PFIZER(Purple Top)SARS-COV-2 Vaccination 05/20/2019, 06/10/2019    Pneumococcal Conjugate-13 06/18/2014   Pneumococcal Polysaccharide-23 06/25/2015   Td 04/10/2011   Tdap 04/10/2011, 06/03/2021   Zoster Recombinat (Shingrix) 07/29/2021, 12/23/2021   Zoster, Live 12/31/2010    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   Colon polyps    Heart murmur    Murmur    Osteopenia    Thyroid disease    UTI (urinary tract infection)    Allergies  Allergen Reactions   Penicillins     REACTION: eyes, lips, and joints swell   Codeine     REACTION: nausea and vomiting   Oxycodone Itching   Past Surgical History:  Procedure Laterality Date   BREAST CYST ASPIRATION     BREAST EXCISIONAL BIOPSY Right    benign   FINGER SURGERY     left ring finger   TONSILLECTOMY     TUBAL LIGATION     WRIST SURGERY     left wrist   Family History  Problem Relation Age of Onset   Arthritis Mother    COPD Mother    Hearing loss Mother    Multiple myeloma Father    Arthritis Sister    Diabetes Sister    Hearing loss Brother    Diabetes Brother    Hearing loss Son    Cancer Paternal Grandfather    Social History   Social History Narrative   Marital status/children/pets: widowed. 1 Son.    Education/employment: 1 yr college. Retired   [employer], [employer]      -  smoke alarm in the home:Yes     - wears seatbelt: Yes     - Feels safe in their relationships: Yes       Allergies as of 12/29/2021       Reactions   Penicillins    REACTION: eyes, lips, and joints swell   Codeine    REACTION: nausea and vomiting   Oxycodone Itching        Medication List        Accurate as of December 29, 2021  8:51 AM. If you have any questions, ask your nurse or doctor.          ascorbic acid 500 MG tablet Commonly known as: VITAMIN C Take 1,000 mg by mouth daily.   calcium-vitamin D 500-200 MG-UNIT tablet Take 2 tablets by mouth. Take 2 pills daily   glucosamine-chondroitin 500-400 MG tablet Take 1 tablet by mouth daily.   levothyroxine  100 MCG tablet Commonly known as: SYNTHROID Take 1 tablet (100 mcg total) by mouth daily.        All past medical history, surgical history, allergies, family history, immunizations andmedications were updated in the EMR today and reviewed under the history and medication portions of their EMR.     No results found for this or any previous visit (from the past 2160 hour(s)).  US BREAST LTD UNI RIGHT INC AXILLA Result Date: 12/26/2021 RECOMMENDATION: Bilateral diagnostic mammogram and right breast ultrasound in December of 2023 is recommended. I have discussed the findings and recommendations with the patient. If applicable, a reminder letter will be sent to the patient regarding the next appointment. BI-RADS CATEGORY  3: Probably benign. Electronically Signed   By: Lillia Mountain M.D.   On: 12/26/2021 13:37    ROS 14 pt review of systems performed and negative (unless mentioned in an HPI)  Objective: BP 131/76   Pulse 74   Temp 97.7 F (36.5 C) (Oral)   Ht _0  (1.676 m)   Wt 143 lb (64.9 kg)   SpO2 100%   BMI 23.08 kg/m  Physical Exam Vitals and nursing note reviewed.  Constitutional:      General: She is not in acute distress.    Appearance: Normal appearance. She is not ill-appearing or toxic-appearing.  HENT:     Head: Normocephalic and atraumatic.     Right Ear: Tympanic membrane, ear canal and external ear normal. There is no impacted cerumen.     Left Ear: Tympanic membrane, ear canal and external ear normal. There is no impacted cerumen.     Nose: No congestion or rhinorrhea.     Mouth/Throat:     Mouth: Mucous membranes are moist.     Pharynx: Oropharynx is clear. No oropharyngeal exudate or posterior oropharyngeal erythema.  Eyes:     General: No scleral icterus.       Right eye: No discharge.        Left eye: No discharge.     Extraocular Movements: Extraocular movements intact.     Conjunctiva/sclera: Conjunctivae normal.     Pupils: Pupils are equal, round,  and reactive to light.  Cardiovascular:     Rate and Rhythm: Normal rate and regular rhythm.     Pulses: Normal pulses.     Heart sounds: Normal heart sounds. No murmur heard.    No friction rub. No gallop.  Pulmonary:     Effort: Pulmonary effort is normal. No respiratory distress.     Breath sounds: Normal breath sounds. No  stridor. No wheezing, rhonchi or rales.  Chest:     Chest wall: No tenderness.  Abdominal:     General: Abdomen is flat. Bowel sounds are normal. There is no distension.     Palpations: Abdomen is soft. There is no mass.     Tenderness: There is no abdominal tenderness. There is no right CVA tenderness, left CVA tenderness, guarding or rebound.     Hernia: No hernia is present.  Musculoskeletal:        General: No swelling, tenderness or deformity. Normal range of motion.     Cervical back: Normal range of motion and neck supple. No rigidity or tenderness.     Right lower leg: No edema.     Left lower leg: No edema.  Lymphadenopathy:     Cervical: No cervical adenopathy.  Skin:    General: Skin is warm and dry.     Coloration: Skin is not jaundiced or pale.     Findings: No bruising, erythema, lesion or rash.  Neurological:     General: No focal deficit present.     Mental Status: She is alert and oriented to person, place, and time. Mental status is at baseline.     Cranial Nerves: No cranial nerve deficit.     Sensory: No sensory deficit.     Motor: No weakness.     Coordination: Coordination normal.     Gait: Gait normal.     Deep Tendon Reflexes: Reflexes normal.  Psychiatric:        Mood and Affect: Mood normal.        Behavior: Behavior normal.        Thought Content: Thought content normal.        Judgment: Judgment normal.      No results found.  Assessment/plan: Paige Rasmussen is a 71 y.o. female present for CPE/CMC Hypothyroidism due to acquired atrophy of thyroid Has been stable.  Continue levo 100 mcg, refills after results  in appropriate dose.  - CBC - Lipid panel - TSH - T4, free  Osteopenia of multiple sites Dexa utd 01/26/2020 (-1.8), rpt 3 yrs - CBC - Comprehensive metabolic panel Ca/vitd supplement recommended.  Vit d collected today  Influenza vaccine needed Declined today- will wait a few more weeks.  Encounter for long-term current use of medication - CBC - Comprehensive metabolic panel - Hemoglobin A1c Routine general medical examination at a health care facility Colonoscopy: completed 08/2015, by Dr. Henrene Pastor. follow up 10 yrs. Mammogram: completed: 05/15/2021, [facility] has order for dx/Us in 03/2022 for probably benign right abnormal mam.  Cervical cancer screening: last pap: n/a  Immunizations: tdap UTD 2023, Influenza wants to wait a few more weeks. (encouraged yearly), PNA series completed, shingrix series completed Infectious disease screening: Hep C  completed DEXA: last completed 01/2020, result osteopenia, follow needed 8yr Patient was encouraged to exercise greater than 150 minutes a week. Patient was encouraged to choose a diet filled with fresh fruits and vegetables, and lean meats. AVS provided to patient today for education/recommendation on gender specific health and safety maintenance.  Return in about 1 year (around 12/30/2022) for cpe (20 min), Routine chronic condition follow-up.  Orders Placed This Encounter  Procedures   CBC   Comprehensive metabolic panel   Hemoglobin A1c   Lipid panel   TSH   T4, free   Vitamin D (25 hydroxy)   Meds ordered this encounter  Medications   levothyroxine (SYNTHROID) 100 MCG tablet    Sig:  Take 1 tablet (100 mcg total) by mouth daily.    Dispense:  90 tablet    Refill:  0   Referral Orders  No referral(s) requested today     Electronically signed by: Howard Pouch, [place]

## 2021-12-30 ENCOUNTER — Other Ambulatory Visit: Payer: Self-pay | Admitting: Family Medicine

## 2021-12-30 DIAGNOSIS — E038 Other specified hypothyroidism: Secondary | ICD-10-CM

## 2021-12-30 MED ORDER — LEVOTHYROXINE SODIUM 100 MCG PO TABS: 100.0000 ug | ORAL_TABLET | Freq: Every day | ORAL | 3 refills | Status: DC

## 2022-01-07 ENCOUNTER — Ambulatory Visit (INDEPENDENT_AMBULATORY_CARE_PROVIDER_SITE_OTHER): Payer: PPO

## 2022-01-07 DIAGNOSIS — Z0000 Encounter for general adult medical examination without abnormal findings: Secondary | ICD-10-CM | POA: Diagnosis not present

## 2022-01-07 NOTE — Patient Instructions (Signed)
[name] , Thank you for taking time to come for your Medicare Wellness Visit. I appreciate your ongoing commitment to your health goals. Please review the following plan we discussed and let me know if I can assist you in the future.   These are the goals we discussed:  Goals      Patient Stated     Continue current lifestyle      Patient Stated     Maintain health and exercise         This is a list of the screening recommended for you and due dates:  Health Maintenance  Topic Date Due   COVID-19 Vaccine (3 - Pfizer risk series) 01/14/2022*   Flu Shot  07/12/2022*   DEXA scan (bone density measurement)  01/26/2023   Mammogram  04/03/2023   Colon Cancer Screening  09/02/2025   Tetanus Vaccine  06/04/2031   Pneumonia Vaccine  Completed   Hepatitis C Screening: USPSTF Recommendation to screen - Ages 18-79 yo.  Completed   Zoster (Shingles) Vaccine  Completed   HPV Vaccine  Aged Out  *Topic was postponed. The date shown is not the original due date.    Advanced directives: Please bring a copy of your health care power of attorney and living will to the office at your convenience.  Conditions/risks identified: maintain health and exercise  Next appointment: Follow up in one year for your annual wellness visit    Preventive Care 65 Years and Older, Female Preventive care refers to lifestyle choices and visits with your health care provider that can promote health and wellness. What does preventive care include? A yearly physical exam. This is also called an annual well check. Dental exams once or twice a year. Routine eye exams. Ask your health care provider how often you should have your eyes checked. Personal lifestyle choices, including: Daily care of your teeth and gums. Regular physical activity. Eating a healthy diet. Avoiding tobacco and drug use. Limiting alcohol use. Practicing safe sex. Taking low-dose aspirin every day. Taking vitamin and mineral  supplements as recommended by your health care provider. What happens during an annual well check? The services and screenings done by your health care provider during your annual well check will depend on your age, overall health, lifestyle risk factors, and family history of disease. Counseling  Your health care provider may ask you questions about your: Alcohol use. Tobacco use. Drug use. Emotional well-being. Home and relationship well-being. Sexual activity. Eating habits. History of falls. Memory and ability to understand (cognition). Work and work Statistician. Reproductive health. Screening  You may have the following tests or measurements: Height, weight, and BMI. Blood pressure. Lipid and cholesterol levels. These may be checked every 5 years, or more frequently if you are over 62 years old. Skin check. Lung cancer screening. You may have this screening every year starting at age 3 if you have a 30-pack-year history of smoking and currently smoke or have quit within the past 15 years. Fecal occult blood test (FOBT) of the stool. You may have this test every year starting at age 84. Flexible sigmoidoscopy or colonoscopy. You may have a sigmoidoscopy every 5 years or a colonoscopy every 10 years starting at age 76. Hepatitis C blood test. Hepatitis B blood test. Sexually transmitted disease (STD) testing. Diabetes screening. This is done by checking your blood sugar (glucose) after you have not eaten for a while (fasting). You may have this done every 1-3 years. Bone density scan. This  is done to screen for osteoporosis. You may have this done starting at age 92. Mammogram. This may be done every 1-2 years. Talk to your health care provider about how often you should have regular mammograms. Talk with your health care provider about your test results, treatment options, and if necessary, the need for more tests. Vaccines  Your health care provider may recommend certain  vaccines, such as: Influenza vaccine. This is recommended every year. Tetanus, diphtheria, and acellular pertussis (Tdap, Td) vaccine. You may need a Td booster every 10 years. Zoster vaccine. You may need this after age 75. Pneumococcal 13-valent conjugate (PCV13) vaccine. One dose is recommended after age 51. Pneumococcal polysaccharide (PPSV23) vaccine. One dose is recommended after age 5. Talk to your health care provider about which screenings and vaccines you need and how often you need them. This information is not intended to replace advice given to you by your health care provider. Make sure you discuss any questions you have with your health care provider. Document Released: 04/26/2015 Document Revised: 12/18/2015 Document Reviewed: 01/29/2015 Elsevier Interactive Patient Education  2017 [place] Prevention in the Home Falls can cause injuries. They can happen to people of all ages. There are many things you can do to make your home safe and to help prevent falls. What can I do on the outside of my home? Regularly fix the edges of walkways and driveways and fix any cracks. Remove anything that might make you trip as you walk through a door, such as a raised step or threshold. Trim any bushes or trees on the path to your home. Use bright outdoor lighting. Clear any walking paths of anything that might make someone trip, such as rocks or tools. Regularly check to see if handrails are loose or broken. Make sure that both sides of any steps have handrails. Any raised decks and porches should have guardrails on the edges. Have any leaves, snow, or ice cleared regularly. Use sand or salt on walking paths during winter. Clean up any spills in your garage right away. This includes oil or grease spills. What can I do in the bathroom? Use night lights. Install grab bars by the toilet and in the tub and shower. Do not use towel bars as grab bars. Use non-skid mats or decals in  the tub or shower. If you need to sit down in the shower, use a plastic, non-slip stool. Keep the floor dry. Clean up any water that spills on the floor as soon as it happens. Remove soap buildup in the tub or shower regularly. Attach bath mats securely with double-sided non-slip rug tape. Do not have throw rugs and other things on the floor that can make you trip. What can I do in the bedroom? Use night lights. Make sure that you have a light by your bed that is easy to reach. Do not use any sheets or blankets that are too big for your bed. They should not hang down onto the floor. Have a firm chair that has side arms. You can use this for support while you get dressed. Do not have throw rugs and other things on the floor that can make you trip. What can I do in the kitchen? Clean up any spills right away. Avoid walking on wet floors. Keep items that you use a lot in easy-to-reach places. If you need to reach something above you, use a strong step stool that has a grab bar. Keep electrical cords out  of the way. Do not use floor polish or wax that makes floors slippery. If you must use wax, use non-skid floor wax. Do not have throw rugs and other things on the floor that can make you trip. What can I do with my stairs? Do not leave any items on the stairs. Make sure that there are handrails on both sides of the stairs and use them. Fix handrails that are broken or loose. Make sure that handrails are as long as the stairways. Check any carpeting to make sure that it is firmly attached to the stairs. Fix any carpet that is loose or worn. Avoid having throw rugs at the top or bottom of the stairs. If you do have throw rugs, attach them to the floor with carpet tape. Make sure that you have a light switch at the top of the stairs and the bottom of the stairs. If you do not have them, ask someone to add them for you. What else can I do to help prevent falls? Wear shoes that: Do not have high  heels. Have rubber bottoms. Are comfortable and fit you well. Are closed at the toe. Do not wear sandals. If you use a stepladder: Make sure that it is fully opened. Do not climb a closed stepladder. Make sure that both sides of the stepladder are locked into place. Ask someone to hold it for you, if possible. Clearly mark and make sure that you can see: Any grab bars or handrails. First and last steps. Where the edge of each step is. Use tools that help you move around (mobility aids) if they are needed. These include: Canes. Walkers. Scooters. Crutches. Turn on the lights when you go into a dark area. Replace any light bulbs as soon as they burn out. Set up your furniture so you have a clear path. Avoid moving your furniture around. If any of your floors are uneven, fix them. If there are any pets around you, be aware of where they are. Review your medicines with your doctor. Some medicines can make you feel dizzy. This can increase your chance of falling. Ask your doctor what other things that you can do to help prevent falls. This information is not intended to replace advice given to you by your health care provider. Make sure you discuss any questions you have with your health care provider. Document Released: 01/24/2009 Document Revised: 09/05/2015 Document Reviewed: 05/04/2014 Elsevier Interactive Patient Education  2017 [employer].

## 2022-01-07 NOTE — Progress Notes (Signed)
Virtual Visit via Telephone Note  I connected with  Paige Rasmussen on 01/07/22 at 10:30 AM EDT by telephone and verified that I am speaking with the correct person using two identifiers.  Medicare Annual Wellness visit completed telephonically due to Covid-19 pandemic.   Persons participating in this call: This Health Coach and this patient.   Location: Patient: home Provider: office   I discussed the limitations, risks, security and privacy concerns of performing an evaluation and management service by telephone and the availability of in person appointments. The patient expressed understanding and agreed to proceed.  Unable to perform video visit due to video visit attempted and failed and/or patient does not have video capability.   Some vital signs may be absent or patient reported.   Willette Brace, LPN   Subjective:   Paige Rasmussen is a 71 y.o. female who presents for Medicare Annual (Subsequent) preventive examination.  Review of Systems     Cardiac Risk Factors include: advanced age (>46mn, >>28women)     Objective:    There were no vitals filed for this visit. There is no height or weight on file to calculate BMI.     01/07/2022   10:20 AM 01/01/2021   11:47 AM 12/13/2019    9:34 AM 10/21/2018   11:45 AM 10/11/2018   10:27 AM 12/11/2017    9:35 AM 06/29/2016    9:33 AM  Advanced Directives  Does Patient Have a Medical Advance Directive? Yes Yes Yes Yes Yes No Yes  Type of AParamedicof AVan [place] will Healthcare Power of [place] will Living will Living will;Healthcare Power of Attorney    Does patient want to make changes to medical advance directive?   No - Patient declined  No - Patient declined  No - Patient declined  Copy of [place]? No - copy requested No - copy requested No - copy requested  No - copy requested      Current Medications  (verified) Outpatient Encounter Medications as of 01/07/2022  Medication Sig   Calcium Carbonate-Vitamin D (CALCIUM-VITAMIN D) 500-200 MG-UNIT per tablet Take 2 tablets by mouth. Take 2 pills daily   glucosamine-chondroitin 500-400 MG tablet Take 1 tablet by mouth daily.    levothyroxine (SYNTHROID) 100 MCG tablet Take 1 tablet (100 mcg total) by mouth daily.   vitamin C (ASCORBIC ACID) 500 MG tablet Take 1,000 mg by mouth daily.    No facility-administered encounter medications on file as of 01/07/2022.    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   Colon polyps    Heart murmur    Murmur    Osteopenia    Thyroid disease    UTI (urinary tract infection)    Past Surgical History:  Procedure Laterality Date   BREAST CYST ASPIRATION     BREAST EXCISIONAL BIOPSY Right    benign   FINGER SURGERY     left ring finger   TONSILLECTOMY     TUBAL LIGATION     WRIST SURGERY     left wrist   Family History  Problem Relation Age of Onset   Arthritis Mother    COPD Mother    Hearing loss Mother    Multiple myeloma Father    Arthritis Sister    Diabetes Sister    Hearing loss Brother  Diabetes Brother    Hearing loss Son    Cancer Paternal Grandfather    Social History   Socioeconomic History   Marital status: Widowed    Spouse name: Not on file   Number of children: Not on file   Years of education: Not on file   Highest education level: Not on file  Occupational History   Not on file  Tobacco Use   Smoking status: Former    Types: Cigarettes    Quit date: 11/30/1982    Years since quitting: 39.1    Passive exposure: Never   Smokeless tobacco: Never  Vaping Use   Vaping Use: Never used  Substance and Sexual Activity   Alcohol use: Yes    Comment: socially   Drug use: Never   Sexual activity: Not Currently  Other Topics Concern   Not on file  Social History Narrative    Marital status/children/pets: widowed. 1 Son.    Education/employment: 1 yr college. Retired   Engineer, materials:      -smoke alarm in the home:Yes     - wears seatbelt: Yes     - Feels safe in their relationships: Yes      Social Determinants of Health   Financial Resource Strain: Low Risk  (01/07/2022)   Overall Financial Resource Strain (CARDIA)    Difficulty of Paying Living Expenses: Not hard at all  Food Insecurity: No Food Insecurity (01/07/2022)   Hunger Vital Sign    Worried About Running Out of Food in the Last Year: Never true    Ran Out of Food in the Last Year: Never true  Transportation Needs: No Transportation Needs (01/07/2022)   PRAPARE - Hydrologist (Medical): No    Lack of Transportation (Non-Medical): No  Physical Activity: Insufficiently Active (01/07/2022)   Exercise Vital Sign    Days of Exercise per Week: 4 days    Minutes of Exercise per Session: 30 min  Stress: No Stress Concern Present (01/07/2022)   [place]    Feeling of Stress : Not at all  Social Connections: Moderately Integrated (01/07/2022)   Social Connection and Isolation Panel [NHANES]    Frequency of Communication with Friends and Family: More than three times a week    Frequency of Social Gatherings with Friends and Family: More than three times a week    Attends Religious Services: More than 4 times per year    Active Member of [employer] or Organizations: Yes    Attends Archivist Meetings: More than 4 times per year    Marital Status: Widowed    Tobacco Counseling Counseling given: Not Answered   Clinical Intake:  Pre-visit preparation completed: Yes  Pain : No/denies pain     Nutritional Risks: None Diabetes: No  How often do you need to have someone help you when you read instructions, pamphlets, or other written materials from your doctor or pharmacy?: 1 -  Never  Diabetic?no  Interpreter Needed?: No  Information entered by :: Charlott Rakes, LPN   Activities of Daily Living    01/07/2022   10:21 AM  In your present state of health, do you have any difficulty performing the following activities:  Hearing? 0  Vision? 0  Difficulty concentrating or making decisions? 0  Walking or climbing stairs? 0  Dressing or bathing? 0  Doing errands, shopping? 0  Preparing Food and eating ? N  Using the Toilet? N  In the past six months, have you accidently leaked urine? N  Do you have problems with loss of bowel control? N  Managing your Medications? N  Managing your Finances? N  Housekeeping or managing your Housekeeping? N    Patient Care Team: Ma Hillock, DO as PCP - General (Family Medicine) Leonia Corona, MD as Referring Physician (Ophthalmology)  Indicate any recent Medical Services you may have received from other than [facility] providers in the past year (date may be approximate).     Assessment:   This is a routine wellness examination for Paige Rasmussen.  Hearing/Vision screen Hearing Screening - Comments:: Pt denies any hearing issues  Vision Screening - Comments:: Pt follows up with provider for annual eye exams   Dietary issues and exercise activities discussed: Current Exercise Habits: Home exercise routine, Type of exercise: walking, Time (Minutes): 30, Frequency (Times/Week): 4, Weekly Exercise (Minutes/Week): 120   Goals Addressed             This Visit's Progress    Patient Stated       Maintain health and exercise        Depression Screen    01/07/2022   10:19 AM 12/29/2021    8:30 AM 01/01/2021   11:46 AM 12/27/2020    8:22 AM 12/13/2019    9:32 AM 10/11/2018   10:24 AM 12/07/2017    3:35 PM  PHQ 2/9 Scores  PHQ - 2 Score 0 0 0 0 0 0 0    Fall Risk    01/07/2022   10:21 AM 12/29/2021    8:30 AM 01/01/2021   11:36 AM 12/27/2020    8:23 AM 12/13/2019    9:32 AM  [place] in the past year? 0 0 0 0  1  Number falls in past yr: 0 0 0 0 0  Injury with Fall? 0 0 0 0 0  Risk for fall due to : No Fall Risks No Fall Risks  No Fall Risks No Fall Risks  Follow up Falls prevention discussed Falls evaluation completed Falls evaluation completed;Falls prevention discussed Falls evaluation completed Falls evaluation completed    FALL RISK PREVENTION PERTAINING TO THE HOME:  Any stairs in or around the home? Yes  If so, are there any without handrails? No  Home free of loose throw rugs in walkways, pet beds, electrical cords, etc? Yes  Adequate lighting in your home to reduce risk of falls? Yes   ASSISTIVE DEVICES UTILIZED TO PREVENT FALLS:  Life alert? No  Use of a cane, walker or w/c? No  Grab bars in the bathroom? No  Shower chair or bench in shower? Yes  Elevated toilet seat or a handicapped toilet? No   TIMED UP AND GO:  Was the test performed? No .  Cognitive Function:        01/07/2022   10:22 AM  6CIT Screen  What Year? 0 points  What month? 0 points  What time? 0 points  Count back from 20 0 points  Months in reverse 0 points  Repeat phrase 0 points  Total Score 0 points    Immunizations Immunization History  Administered Date(s) Administered   Fluad Quad(high Dose 65+) 12/07/2018, 01/18/2020, 12/27/2020   Influenza, High Dose Seasonal PF 02/04/2017, 01/25/2018   Influenza,inj,Quad PF,6+ Mos 01/24/2015, 03/03/2016   Influenza-Unspecified 04/10/2011   PFIZER(Purple Top)SARS-COV-2 Vaccination 05/20/2019, 06/10/2019   Pneumococcal Conjugate-13 06/18/2014   Pneumococcal Polysaccharide-23 06/25/2015   Td 04/10/2011   Tdap 04/10/2011,  06/03/2021   Zoster Recombinat (Shingrix) 07/29/2021, 12/23/2021   Zoster, Live 12/31/2010    TDAP status: Up to date  Flu Vaccine status: Due, Education has been provided regarding the importance of this vaccine. Advised may receive this vaccine at local pharmacy or Health Dept. Aware to provide a copy of the vaccination record if  obtained from local pharmacy or Health Dept. Verbalized acceptance and understanding.  Pneumococcal vaccine status: Up to date  Covid-19 vaccine status: Completed vaccines  Qualifies for Shingles Vaccine? Yes   Zostavax completed Yes   Shingrix Completed?: Yes  Screening Tests Health Maintenance  Topic Date Due   COVID-19 Vaccine (3 - Pfizer risk series) 01/14/2022 (Originally 07/08/2019)   INFLUENZA VACCINE  07/12/2022 (Originally 11/11/2021)   DEXA SCAN  01/26/2023   MAMMOGRAM  04/03/2023   COLONOSCOPY (Pts 45-31yr Insurance coverage will need to be confirmed)  09/02/2025   TETANUS/TDAP  06/04/2031   Pneumonia Vaccine 71 Years old  Completed   Hepatitis C Screening  Completed   Zoster Vaccines- Shingrix  Completed   HPV VACCINES  Aged Out    Health Maintenance  There are no preventive care reminders to display for this patient.  Colorectal cancer screening: Type of screening: Colonoscopy. Completed 09/03/15. Repeat every 10 years  Mammogram status: Completed 04/02/21. Repeat every year  Bone Density status: Completed 01/26/20. Results reflect: Bone density results: OSTEOPENIA. Repeat every 3 years.   Additional Screening:  Hepatitis C Screening:  Completed 12/27/20  Vision Screening: Recommended annual ophthalmology exams for early detection of glaucoma and other disorders of the eye. Is the patient up to date with their annual eye exam?  Yes  Who is the provider or what is the name of the office in which the patient attends annual eye exams[place] If pt is not established with a provider, would they like to be referred to a provider to establish care? No .   Dental Screening: Recommended annual dental exams for proper oral hygiene  Community Resource Referral / Chronic Care Management: CRR required this visit?  No   CCM required this visit?  No      Plan:     I have personally reviewed and noted the following in the patient's chart:   Medical  and social history Use of alcohol, tobacco or illicit drugs  Current medications and supplements including opioid prescriptions. Patient is not currently taking opioid prescriptions. Functional ability and status Nutritional status Physical activity Advanced directives List of other physicians Hospitalizations, surgeries, and ER visits in previous 12 months Vitals Screenings to include cognitive, depression, and falls Referrals and appointments  In addition, I have reviewed and discussed with patient certain preventive protocols, quality metrics, and best practice recommendations. A written personalized care plan for preventive services as well as general preventive health recommendations were provided to patient.     TWillette Brace LPN   98/88/2800  Nurse Notes: none

## 2022-03-27 DIAGNOSIS — Z719 Counseling, unspecified: Secondary | ICD-10-CM

## 2022-04-03 ENCOUNTER — Ambulatory Visit
Admission: RE | Admit: 2022-04-03 | Discharge: 2022-04-03 | Disposition: A | Discharge status: Home / Self Care | Payer: PPO | Source: Ambulatory Visit | Attending: Family Medicine | Admitting: Family Medicine

## 2022-04-03 DIAGNOSIS — N6001 Solitary cyst of right breast: Secondary | ICD-10-CM

## 2022-04-03 DIAGNOSIS — N6011 Diffuse cystic mastopathy of right breast: Secondary | ICD-10-CM | POA: Diagnosis not present

## 2022-09-23 ENCOUNTER — Telehealth: Payer: Self-pay | Admitting: Family Medicine

## 2022-09-23 NOTE — Telephone Encounter (Signed)
Please inform patient labs cannot be drawn prior to appointments for physicals any longer due to insurance regulations.

## 2022-09-23 NOTE — Telephone Encounter (Signed)
Patient would like someone to give her a call back regarding a question she has about scheduling her labs prior to her CPE a few days before..    Thank you,  Gabriel Cirri [facility] AWV TEAM Direct Dial [phone]

## 2022-09-23 NOTE — Telephone Encounter (Signed)
LM for pt to return call to discuss.  

## 2022-09-25 NOTE — Telephone Encounter (Signed)
noted 

## 2022-09-25 NOTE — Telephone Encounter (Signed)
Patient is aware that she can not have labs done prior to her appointment in Sept.

## 2022-11-26 ENCOUNTER — Encounter (INDEPENDENT_AMBULATORY_CARE_PROVIDER_SITE_OTHER): Payer: Self-pay

## 2023-01-01 ENCOUNTER — Ambulatory Visit (INDEPENDENT_AMBULATORY_CARE_PROVIDER_SITE_OTHER): Payer: PPO | Admitting: Family Medicine

## 2023-01-01 ENCOUNTER — Encounter: Payer: Self-pay | Admitting: Family Medicine

## 2023-01-01 ENCOUNTER — Other Ambulatory Visit: Payer: Self-pay | Admitting: Family Medicine

## 2023-01-01 VITALS — BP 138/82 | HR 84 | Temp 98.1°F | Ht 66.0 in | Wt 145.0 lb

## 2023-01-01 DIAGNOSIS — M8589 Other specified disorders of bone density and structure, multiple sites: Secondary | ICD-10-CM | POA: Diagnosis not present

## 2023-01-01 DIAGNOSIS — E038 Other specified hypothyroidism: Secondary | ICD-10-CM

## 2023-01-01 DIAGNOSIS — Z131 Encounter for screening for diabetes mellitus: Secondary | ICD-10-CM | POA: Diagnosis not present

## 2023-01-01 DIAGNOSIS — E063 Autoimmune thyroiditis: Secondary | ICD-10-CM

## 2023-01-01 DIAGNOSIS — Z13220 Encounter for screening for lipoid disorders: Secondary | ICD-10-CM | POA: Diagnosis not present

## 2023-01-01 DIAGNOSIS — Z23 Encounter for immunization: Secondary | ICD-10-CM

## 2023-01-01 DIAGNOSIS — Z1231 Encounter for screening mammogram for malignant neoplasm of breast: Secondary | ICD-10-CM

## 2023-01-01 DIAGNOSIS — Z0000 Encounter for general adult medical examination without abnormal findings: Secondary | ICD-10-CM

## 2023-01-01 LAB — COMPREHENSIVE METABOLIC PANEL
ALT: 11 U/L (ref 0–35)
AST: 16 U/L (ref 0–37)
Albumin: 4.5 g/dL (ref 3.5–5.2)
Alkaline Phosphatase: 59 U/L (ref 39–117)
BUN: 10 mg/dL (ref 6–23)
CO2: 30 mEq/L (ref 19–32)
Calcium: 10 mg/dL (ref 8.4–10.5)
Chloride: 97 mEq/L (ref 96–112)
Creatinine, Ser: 0.73 mg/dL (ref 0.40–1.20)
GFR: 81.32 mL/min (ref >60.00)
Glucose, Bld: 99 mg/dL (ref 70–99)
Potassium: 4.6 mEq/L (ref 3.5–5.1)
Sodium: 136 mEq/L (ref 135–145)
Total Bilirubin: 0.7 mg/dL (ref 0.2–1.2)
Total Protein: 7.3 g/dL (ref 6.0–8.3)

## 2023-01-01 LAB — LIPID PANEL
Cholesterol: 220 mg/dL — ABNORMAL HIGH (ref 0–200)
HDL: 79.4 mg/dL (ref >39.00)
LDL Cholesterol: 113 mg/dL — ABNORMAL HIGH (ref 0–99)
NonHDL: 140.4
Total CHOL/HDL Ratio: 3
Triglycerides: 138 mg/dL (ref 0.0–149.0)
VLDL: 27.6 mg/dL (ref 0.0–40.0)

## 2023-01-01 LAB — CBC WITH DIFFERENTIAL/PLATELET
Basophils Absolute: 0 10*3/uL (ref 0.0–0.1)
Basophils Relative: 0.6 % (ref 0.0–3.0)
Eosinophils Absolute: 0.3 10*3/uL (ref 0.0–0.7)
Eosinophils Relative: 4.6 % (ref 0.0–5.0)
HCT: 39.5 % (ref 36.0–46.0)
Hemoglobin: 12.7 g/dL (ref 12.0–15.0)
Lymphocytes Relative: 31.3 % (ref 12.0–46.0)
Lymphs Abs: 2 10*3/uL (ref 0.7–4.0)
MCHC: 32.2 g/dL (ref 30.0–36.0)
MCV: 93.2 fl (ref 78.0–100.0)
Monocytes Absolute: 0.6 10*3/uL (ref 0.1–1.0)
Monocytes Relative: 9.1 % (ref 3.0–12.0)
Neutro Abs: 3.5 10*3/uL (ref 1.4–7.7)
Neutrophils Relative %: 54.4 % (ref 43.0–77.0)
Platelets: 307 10*3/uL (ref 150.0–400.0)
RBC: 4.24 Mil/uL (ref 3.87–5.11)
RDW: 13.5 % (ref 11.5–15.5)
WBC: 6.4 10*3/uL (ref 4.0–10.5)

## 2023-01-01 LAB — HEMOGLOBIN A1C: Hgb A1c MFr Bld: 5.8 % (ref 4.6–6.5)

## 2023-01-01 LAB — TSH: TSH: 5.71 u[IU]/mL — ABNORMAL HIGH (ref 0.35–5.50)

## 2023-01-01 MED ORDER — HYDROCHLOROTHIAZIDE 12.5 MG PO CAPS: 12.5000 mg | ORAL_CAPSULE | Freq: Every day | ORAL | 3 refills | Status: DC

## 2023-01-01 MED ORDER — LEVOTHYROXINE SODIUM 112 MCG PO TABS: 112.0000 ug | ORAL_TABLET | Freq: Every day | ORAL | 3 refills | Status: DC

## 2023-01-01 NOTE — Patient Instructions (Addendum)
Return in about 1 year (around 01/02/2024) for cpe (20 min).   Start hydrochlorothiazide pill to help with your Blood pressure.  Low sodium diet recommended.      Great to see you today.  I have refilled the medication(s) we provide.   If labs were collected or images ordered, we will inform you of  results once we have received them and reviewed. We will contact you either by echart message, or telephone call.  Please give ample time to the testing facility, and our office to run,  receive and review results. Please do not call inquiring of results, even if you can see them in your chart. We will contact you as soon as we are able. If it has been over 1 week since the test was completed, and you have not yet heard from [place], then please call us.    - echart message- for normal results that have been seen by the patient already.   - telephone call: abnormal results or if patient has not viewed results in their echart.  If a referral to a specialist was entered for you, please call us in 2 weeks if you have not heard from the specialist office to schedule.

## 2023-01-01 NOTE — Progress Notes (Signed)
Patient ID: Paige Rasmussen, female  DOB: Sep 14, 1950, 72 y.o.   MRN: [id number] Patient Care Team    Relationship Specialty Notifications Start End  Natalia Leatherwood, DO PCP - General Family Medicine  07/25/20   Cain Saupe, MD Referring Physician Ophthalmology  12/27/20     Chief Complaint  Patient presents with   Annual Exam    Pt is fasting    Subjective:  Paige Rasmussen is a 72 y.o.  Female  present for CPE and chronic condition management All past medical history, surgical history, allergies, family history, immunizations, medications and social history were updated in the electronic medical record today. All recent labs, ED visits and hospitalizations within the last year were reviewed.  Health maintenance:  Colonoscopy: completed 08/2015, by Dr. Marina Goodell. follow up 10 yrs. Mammogram: completed: 04/03/2022, [facility] has order for dx/Us in 03/2022 for probably benign right abnormal mam.  Cervical cancer screening: last pap: n/a  Immunizations: tdap UTD 2023, Influenza wants to wait a few more weeks. (encouraged yearly), PNA series completed, shingrix series completed Infectious disease screening: Hep C  completed DEXA: last completed 01/2020, result osteopenia, follow needed 57yrs-ordered today Patient has a Dental home. Hospitalizations/ED visits: Reviewed     01/01/2023    7:55 AM 01/07/2022   10:19 AM 12/29/2021    8:30 AM 01/01/2021   11:46 AM 12/27/2020    8:22 AM  Depression screen PHQ 2/9  Decreased Interest 0 0 0 0 0  Down, Depressed, Hopeless 0 0 0 0 0  PHQ - 2 Score 0 0 0 0 0       No data to display           Immunization History  Administered Date(s) Administered   Fluad Quad(high Dose 65+) 12/07/2018, 01/18/2020, 12/27/2020, 02/05/2022   Fluad Trivalent(High Dose 65+) 01/01/2023   Influenza, High Dose Seasonal PF 02/04/2017, 01/25/2018   Influenza,inj,Quad PF,6+ Mos 01/24/2015, 03/03/2016   Influenza-Unspecified 04/10/2011    PFIZER(Purple Top)SARS-COV-2 Vaccination 05/20/2019, 06/10/2019   Pneumococcal Conjugate-13 06/18/2014   Pneumococcal Polysaccharide-23 06/25/2015   Td 04/10/2011   Tdap 04/10/2011, 06/03/2021   Zoster Recombinant(Shingrix) 07/29/2021, 12/23/2021   Zoster, Live 12/31/2010    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   Colon polyps    Heart murmur    Murmur    Osteopenia    Thyroid disease    UTI (urinary tract infection)    Allergies  Allergen Reactions   Penicillins     REACTION: eyes, lips, and joints swell   Codeine     REACTION: nausea and vomiting   Oxycodone Itching   Past Surgical History:  Procedure Laterality Date   BREAST CYST ASPIRATION     BREAST EXCISIONAL BIOPSY Right    benign   FINGER SURGERY     left ring finger   TONSILLECTOMY     TUBAL LIGATION     WRIST SURGERY     left wrist   Family History  Problem Relation Age of Onset   Arthritis Mother    COPD Mother    Hearing loss Mother    Multiple myeloma Father    Arthritis Sister    Diabetes Sister    Hearing loss Brother    Diabetes Brother    Hearing loss Son    Cancer Paternal Grandfather    Social History   Social History Narrative   Marital status/children/pets: widowed. 1 Son.    Education/employment: 1  yr college. Retired   Field seismologist:      -smoke alarm in the home:Yes     - wears seatbelt: Yes     - Feels safe in their relationships: Yes       Allergies as of 01/01/2023       Reactions   Penicillins    REACTION: eyes, lips, and joints swell   Codeine    REACTION: nausea and vomiting   Oxycodone Itching        Medication List        Accurate as of January 01, 2023  8:23 AM. If you have any questions, ask your nurse or doctor.          ascorbic acid 500 MG tablet Commonly known as: VITAMIN C Take 1,000 mg by mouth daily.   calcium-vitamin D 500-200 MG-UNIT tablet Take 2 tablets by mouth. Take 2 pills daily   glucosamine-chondroitin  500-400 MG tablet Take 1 tablet by mouth daily.   hydrochlorothiazide 12.5 MG capsule Commonly known as: MICROZIDE Take 1 capsule (12.5 mg total) by mouth daily. Started by: Felix Pacini   levothyroxine 100 MCG tablet Commonly known as: SYNTHROID Take 1 tablet (100 mcg total) by mouth daily.        All past medical history, surgical history, allergies, family history, immunizations andmedications were updated in the EMR today and reviewed under the history and medication portions of their EMR.     No results found for this or any previous visit (from the past 2160 hour(s)).    ROS 14 pt review of systems performed and negative (unless mentioned in an HPI)  Objective: BP 138/82   Pulse 84   Temp 98.1 F (36.7 C)   Ht 5\' 6"  (1.676 m)   Wt 145 lb (65.8 kg)   SpO2 97%   BMI 23.40 kg/m  Physical Exam Vitals and nursing note reviewed.  Constitutional:      General: She is not in acute distress.    Appearance: Normal appearance. She is not ill-appearing or toxic-appearing.  HENT:     Head: Normocephalic and atraumatic.     Right Ear: Tympanic membrane, ear canal and external ear normal. There is no impacted cerumen.     Left Ear: Tympanic membrane, ear canal and external ear normal. There is no impacted cerumen.     Nose: No congestion or rhinorrhea.     Mouth/Throat:     Mouth: Mucous membranes are moist.     Pharynx: Oropharynx is clear. No oropharyngeal exudate or posterior oropharyngeal erythema.  Eyes:     General: No scleral icterus.       Right eye: No discharge.        Left eye: No discharge.     Extraocular Movements: Extraocular movements intact.     Conjunctiva/sclera: Conjunctivae normal.     Pupils: Pupils are equal, round, and reactive to light.  Cardiovascular:     Rate and Rhythm: Normal rate and regular rhythm.     Pulses: Normal pulses.     Heart sounds: Normal heart sounds. No murmur heard.    No friction rub. No gallop.  Pulmonary:     Effort:  Pulmonary effort is normal. No respiratory distress.     Breath sounds: Normal breath sounds. No stridor. No wheezing, rhonchi or rales.  Chest:     Chest wall: No tenderness.  Abdominal:     General: Abdomen is flat. Bowel sounds are normal. There is no distension.  Palpations: Abdomen is soft. There is no mass.     Tenderness: There is no abdominal tenderness. There is no right CVA tenderness, left CVA tenderness, guarding or rebound.     Hernia: No hernia is present.  Musculoskeletal:        General: No swelling, tenderness or deformity. Normal range of motion.     Cervical back: Normal range of motion and neck supple. No rigidity or tenderness.     Right lower leg: No edema.     Left lower leg: No edema.  Lymphadenopathy:     Cervical: No cervical adenopathy.  Skin:    General: Skin is warm and dry.     Coloration: Skin is not jaundiced or pale.     Findings: No bruising, erythema, lesion or rash.  Neurological:     General: No focal deficit present.     Mental Status: She is alert and oriented to person, place, and time. Mental status is at baseline.     Cranial Nerves: No cranial nerve deficit.     Sensory: No sensory deficit.     Motor: No weakness.     Coordination: Coordination normal.     Gait: Gait normal.     Deep Tendon Reflexes: Reflexes normal.  Psychiatric:        Mood and Affect: Mood normal.        Behavior: Behavior normal.        Thought Content: Thought content normal.        Judgment: Judgment normal.      No results found.  Assessment/plan: Sameria Contreras is a 72 y.o. female present for CPE and chronic condition management Hypothyroidism due to acquired atrophy of thyroid Continue levo 100 mcg, refills after results in appropriate dose.  TSH collected today  Osteopenia of multiple sites Dexa utd 01/26/2020 (-1.8), rpt 3 yrs-ordered Ca/vitd supplement recommended.  Vit d collected today  Osteopenia of multiple sites - DG Bone  Density; Future Influenza vaccine needed - Flu Vaccine Trivalent High Dose (Fluad) Breast cancer screening by mammogram - MM 3D SCREENING MAMMOGRAM BILATERAL BREAST; Future Lipid screening - Lipid panel Diabetes mellitus screening - Hemoglobin A1c  Routine general medical examination at a health care facility Patient was encouraged to exercise greater than 150 minutes a week. Patient was encouraged to choose a diet filled with fresh fruits and vegetables, and lean meats. AVS provided to patient today for education/recommendation on gender specific health and safety maintenance. Colonoscopy: completed 08/2015, by Dr. Marina Goodell. follow up 10 yrs. Mammogram: completed: 04/03/2022, [facility] has order for dx/Us in 03/2022 for probably benign right abnormal mam.  Cervical cancer screening: last pap: n/a  Immunizations: tdap UTD 2023, Influenza wants to wait a few more weeks. (encouraged yearly), PNA series completed, shingrix series completed Infectious disease screening: Hep C  completed DEXA: last completed 01/2020, result osteopenia, follow needed 77yrs-ordered today - CBC with Differential/Platelet - Comprehensive metabolic panel - TSH Return in about 1 year (around 01/02/2024) for cpe (20 min).  Orders Placed This Encounter  Procedures   DG Bone Density   MM 3D SCREENING MAMMOGRAM BILATERAL BREAST   Flu Vaccine Trivalent High Dose (Fluad)   CBC with Differential/Platelet   Comprehensive metabolic panel   Hemoglobin A1c   TSH   Lipid panel   Meds ordered this encounter  Medications   hydrochlorothiazide (MICROZIDE) 12.5 MG capsule    Sig: Take 1 capsule (12.5 mg total) by mouth daily.    Dispense:  90 capsule  Refill:  3   Referral Orders  No referral(s) requested today     Electronically signed by: Felix Pacini, DO [facility] [place]

## 2023-01-13 ENCOUNTER — Ambulatory Visit: Payer: PPO

## 2023-01-13 VITALS — Wt 145.0 lb

## 2023-01-13 DIAGNOSIS — Z0000 Encounter for general adult medical examination without abnormal findings: Secondary | ICD-10-CM | POA: Diagnosis not present

## 2023-01-13 NOTE — Progress Notes (Addendum)
//  Subjective:   Paige Rasmussen is a 72 y.o. female who presents for Medicare Annual (Subsequent) preventive examination.  Visit Complete: Virtual  I connected with  Loa Socks on 01/18/23 by a audio enabled telemedicine application and verified that I am speaking with the correct person using two identifiers.  Patient Location: Home  Provider Location: Home Office  I discussed the limitations of evaluation and management by telemedicine. The patient expressed understanding and agreed to proceed.  Patient Medicare AWV questionnaire was completed by the patient on 01/11/23; I have confirmed that all information answered by patient is correct and no changes since this date.  Because this visit was a virtual/telehealth visit, some criteria may be missing or patient reported. Any vitals not documented were not able to be obtained and vitals that have been documented are patient reported.    Cardiac Risk Factors include: advanced age (>29men, >66 women)     Objective:    Today's Vitals   01/13/23 1136  Weight: 145 lb (65.8 kg)   Body mass index is 23.4 kg/m.     01/13/2023   11:39 AM 01/07/2022   10:20 AM 01/01/2021   11:47 AM 12/13/2019    9:34 AM 10/21/2018   11:45 AM 10/11/2018   10:27 AM 12/11/2017    9:35 AM  Advanced Directives  Does Patient Have a Medical Advance Directive? Yes Yes Yes Yes Yes Yes No  Type of Estate agent of [place];Living will Healthcare Power of [place];Living will Healthcare Power of [employer] of [place];Living will Living will Living will;Healthcare Power of Attorney   Does patient want to make changes to medical advance directive?    No - Patient declined  No - Patient declined   Copy of Healthcare Power of Attorney in Chart? No - copy requested No - copy requested No - copy requested No - copy requested  No - copy requested     Current Medications (verified) Outpatient Encounter Medications  as of 01/13/2023  Medication Sig   Calcium Carbonate-Vitamin D (CALCIUM-VITAMIN D) 500-200 MG-UNIT per tablet Take 2 tablets by mouth. Take 2 pills daily   glucosamine-chondroitin 500-400 MG tablet Take 1 tablet by mouth daily.    hydrochlorothiazide (MICROZIDE) 12.5 MG capsule Take 1 capsule (12.5 mg total) by mouth daily.   levothyroxine (SYNTHROID) 112 MCG tablet Take 1 tablet (112 mcg total) by mouth daily.   vitamin C (ASCORBIC ACID) 500 MG tablet Take 1,000 mg by mouth daily.    No facility-administered encounter medications on file as of 01/13/2023.    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   Colon polyps    Heart murmur    Murmur    Osteopenia    Thyroid disease    UTI (urinary tract infection)    Past Surgical History:  Procedure Laterality Date   BREAST CYST ASPIRATION     BREAST EXCISIONAL BIOPSY Right    benign   FINGER SURGERY     left ring finger   TONSILLECTOMY     TUBAL LIGATION     WRIST SURGERY     left wrist   Family History  Problem Relation Age of Onset   Arthritis Mother    COPD Mother    Hearing loss Mother    Multiple myeloma Father    Arthritis Sister    Diabetes Sister    Hearing loss  Brother    Diabetes Brother    Hearing loss Son    Cancer Paternal Grandfather    Social History   Socioeconomic History   Marital status: Widowed    Spouse name: Not on file   Number of children: Not on file   Years of education: Not on file   Highest education level: Not on file  Occupational History   Not on file  Tobacco Use   Smoking status: Former    Current packs/day: 0.00    Types: Cigarettes    Quit date: 11/30/1982    Years since quitting: 40.1    Passive exposure: Never   Smokeless tobacco: Never  Vaping Use   Vaping status: Never Used  Substance and Sexual Activity   Alcohol use: Yes    Comment: socially   Drug use: Never   Sexual  activity: Not Currently  Other Topics Concern   Not on file  Social History Narrative   Marital status/children/pets: widowed. 1 Son.    Education/employment: 1 yr college. Retired   Field seismologist:      -smoke alarm in the home:Yes     - wears seatbelt: Yes     - Feels safe in their relationships: Yes      Social Determinants of Health   Financial Resource Strain: Low Risk  (01/07/2022)   Overall Financial Resource Strain (CARDIA)    Difficulty of Paying Living Expenses: Not hard at all  Food Insecurity: No Food Insecurity (01/07/2022)   Hunger Vital Sign    Worried About Running Out of Food in the Last Year: Never true    Ran Out of Food in the Last Year: Never true  Transportation Needs: No Transportation Needs (01/07/2022)   PRAPARE - Administrator, Civil Service (Medical): No    Lack of Transportation (Non-Medical): No  Physical Activity: Sufficiently Active (01/11/2023)   Exercise Vital Sign    Days of Exercise per Week: 7 days    Minutes of Exercise per Session: 30 min  Stress: No Stress Concern Present (01/11/2023)   [employer] of Occupational Health - Occupational Stress Questionnaire    Feeling of Stress : Not at all  Social Connections: Moderately Integrated (01/11/2023)   Social Connection and Isolation Panel [NHANES]    Frequency of Communication with Friends and Family: More than three times a week    Frequency of Social Gatherings with Friends and Family: Twice a week    Attends Religious Services: More than 4 times per year    Active Member of [employer] or Organizations: Yes    Attends Banker Meetings: More than 4 times per year    Marital Status: Widowed    Tobacco Counseling Counseling given: Not Answered   Clinical Intake:  Pre-visit preparation completed: Yes  Pain : No/denies pain     BMI - recorded: 23.4 Nutritional Status: BMI of 19-24  Normal Nutritional Risks: None Diabetes: No  How often do you need to have someone  help you when you read instructions, pamphlets, or other written materials from your doctor or pharmacy?: 1 - Never  Interpreter Needed?: No  Information entered by :: Lanier Ensign, LPN   Activities of Daily Living    01/11/2023   12:10 AM 12/16/2022    9:42 AM  In your present state of health, do you have any difficulty performing the following activities:  Hearing? 0 0  Vision? 0 0  Difficulty concentrating or making decisions? 0 0  Walking or  climbing stairs? 0 0  Dressing or bathing? 0 0  Doing errands, shopping? 0 0  Preparing Food and eating ? N N  Using the Toilet? N N  In the past six months, have you accidently leaked urine? N N  Do you have problems with loss of bowel control? N N  Managing your Medications? N N  Managing your Finances? N N  Housekeeping or managing your Housekeeping? N N    Patient Care Team: Natalia Leatherwood, DO as PCP - General (Family Medicine) Cain Saupe, MD as Referring Physician (Ophthalmology)  Indicate any recent Medical Services you may have received from other than [facility] providers in the past year (date may be approximate).     Assessment:   This is a routine wellness examination for Dajah.  Hearing/Vision screen Hearing Screening - Comments:: Pt denies any hearing issues  Vision Screening - Comments:: Pt follows up with eye tech for annual eye exams    Goals Addressed             This Visit's Progress    Patient Stated       Maintain healthy state        Depression Screen    01/13/2023   11:40 AM 01/01/2023    7:55 AM 01/07/2022   10:19 AM 12/29/2021    8:30 AM 01/01/2021   11:46 AM 12/27/2020    8:22 AM 12/13/2019    9:32 AM  PHQ 2/9 Scores  PHQ - 2 Score 0 0 0 0 0 0 0    Fall Risk    01/11/2023   12:10 AM 01/01/2023    7:55 AM 12/16/2022    9:42 AM 01/07/2022   10:21 AM 12/29/2021    8:30 AM  Fall Risk   Falls in the past year? 0 1 0 0 0  Number falls in past yr:  0  0 0  Injury with Fall? 0 0 0 0 0  Risk  for fall due to : No Fall Risks No Fall Risks  No Fall Risks No Fall Risks  Follow up Falls prevention discussed Falls evaluation completed  Falls prevention discussed Falls evaluation completed    MEDICARE RISK AT HOME: Medicare Risk at Home Any stairs in or around the home?: Yes If so, are there any without handrails?: No Home free of loose throw rugs in walkways, pet beds, electrical cords, etc?: Yes Adequate lighting in your home to reduce risk of falls?: Yes Life alert?: No Use of a cane, walker or w/c?: No Grab bars in the bathroom?: No Shower chair or bench in shower?: No Elevated toilet seat or a handicapped toilet?: Yes  TIMED UP AND GO:  Was the test performed?  No    Cognitive Function:        01/13/2023    7:46 AM 01/07/2022   10:22 AM  6CIT Screen  What Year? 0 points 0 points  What month? 0 points 0 points  What time? 0 points 0 points  Count back from 20 0 points 0 points  Months in reverse 0 points 0 points  Repeat phrase 0 points 0 points  Total Score 0 points 0 points    Immunizations Immunization History  Administered Date(s) Administered   Fluad Quad(high Dose 65+) 12/07/2018, 01/18/2020, 12/27/2020, 02/05/2022   Fluad Trivalent(High Dose 65+) 01/01/2023   Influenza, High Dose Seasonal PF 02/04/2017, 01/25/2018   Influenza,inj,Quad PF,6+ Mos 01/24/2015, 03/03/2016   Influenza-Unspecified 04/10/2011   PFIZER(Purple Top)SARS-COV-2 Vaccination 05/20/2019, 06/10/2019  Pneumococcal Conjugate-13 06/18/2014   Pneumococcal Polysaccharide-23 06/25/2015   Td 04/10/2011   Tdap 04/10/2011, 06/03/2021   Zoster Recombinant(Shingrix) 07/29/2021, 12/23/2021   Zoster, Live 12/31/2010    TDAP status: Up to date  Flu Vaccine status: Up to date  Pneumococcal vaccine status: Up to date  Covid-19 vaccine status: Declined, Education has been provided regarding the importance of this vaccine but patient still declined. Advised may receive this vaccine at local  pharmacy or Health Dept.or vaccine clinic. Aware to provide a copy of the vaccination record if obtained from local pharmacy or Health Dept. Verbalized acceptance and understanding.  Qualifies for Shingles Vaccine? Yes   Zostavax completed Yes   Shingrix Completed?: Yes  Screening Tests Health Maintenance  Topic Date Due   DEXA SCAN  01/26/2023   Medicare Annual Wellness (AWV)  01/13/2024   MAMMOGRAM  04/03/2024   Colonoscopy  09/02/2025   DTaP/Tdap/Td (4 - Td or Tdap) 06/04/2031   Pneumonia Vaccine 67+ Years old  Completed   INFLUENZA VACCINE  Completed   Hepatitis C Screening  Completed   Zoster Vaccines- Shingrix  Completed   HPV VACCINES  Aged Out   COVID-19 Vaccine  Discontinued    Health Maintenance  There are no preventive care reminders to display for this patient.   Colorectal cancer screening: Type of screening: Colonoscopy. Completed 09/03/15. Repeat every 10 years  Mammogram status: Ordered scheduled for 04/05/23. Pt provided with contact info and advised to call to schedule appt.   Bone Density status: Ordered scheduled 07/20/23. Pt provided with contact info and advised to call to schedule appt.  Additional Screening:  Hepatitis C Screening:  Completed 12/27/20  Vision Screening: Recommended annual ophthalmology exams for early detection of glaucoma and other disorders of the eye. Is the patient up to date with their annual eye exam?  Yes  Who is the provider or what is the name of the office in which the patient attends annual eye exams? Eye tech  If pt is not established with a provider, would they like to be referred to a provider to establish care? No .   Dental Screening: Recommended annual dental exams for proper oral hygiene    Community Resource Referral / Chronic Care Management: CRR required this visit?  No   CCM required this visit?  No     Plan:     I have personally reviewed and noted the following in the patient's chart:   Medical and  social history Use of alcohol, tobacco or illicit drugs  Current medications and supplements including opioid prescriptions. Patient is not currently taking opioid prescriptions. Functional ability and status Nutritional status Physical activity Advanced directives List of other physicians Hospitalizations, surgeries, and ER visits in previous 12 months Vitals Screenings to include cognitive, depression, and falls Referrals and appointments  In addition, I have reviewed and discussed with patient certain preventive protocols, quality metrics, and best practice recommendations. A written personalized care plan for preventive services as well as general preventive health recommendations were provided to patient.     Marzella Schlein, LPN   [id number]   After Visit Summary: (MyChart) Due to this being a telephonic visit, the after visit summary with patients personalized plan was offered to patient via MyChart   Nurse Notes: none

## 2023-01-13 NOTE — Patient Instructions (Signed)
Ms. Buist , Thank you for taking time to come for your Medicare Wellness Visit. I appreciate your ongoing commitment to your health goals. Please review the following plan we discussed and let me know if I can assist you in the future.   Referrals/Orders/Follow-Ups/Clinician Recommendations: Aim for 30 minutes of exercise or brisk walking, 6-8 glasses of water, and 5 servings of fruits and vegetables each day.   This is a list of the screening recommended for you and due dates:  Health Maintenance  Topic Date Due   DEXA scan (bone density measurement)  01/26/2023   Medicare Annual Wellness Visit  01/13/2024   Mammogram  04/03/2024   Colon Cancer Screening  09/02/2025   DTaP/Tdap/Td vaccine (4 - Td or Tdap) 06/04/2031   Pneumonia Vaccine  Completed   Flu Shot  Completed   Hepatitis C Screening  Completed   Zoster (Shingles) Vaccine  Completed   HPV Vaccine  Aged Out   COVID-19 Vaccine  Discontinued    Advanced directives: (Copy Requested) Please bring a copy of your health care power of attorney and living will to the office to be added to your chart at your convenience.  Next Medicare Annual Wellness Visit scheduled for next year: Yes

## 2023-02-23 ENCOUNTER — Telehealth: Payer: Self-pay

## 2023-02-23 NOTE — Telephone Encounter (Signed)
Patient thinks she may be having med reaction to hydrochlorothiazide.  States she started feeling a little pressure while urinating. Symptoms started today. Suggested an appt.  Patient states she is in [place] until after Thanksgiving. Should she stop taking medication? Patient states she started taking med in October. Will Azo help since its the beginning of symptoms? Does Dr. Claiborne Billings recommend patient going to urgent care?  Please call [phone]

## 2023-02-23 NOTE — Telephone Encounter (Signed)
Spoke with patient regarding results/recommendations.  

## 2023-04-05 ENCOUNTER — Inpatient Hospital Stay
Admission: RE | Admit: 2023-04-05 | Discharge: 2023-04-05 | Discharge status: Home / Self Care | Payer: PPO | Source: Ambulatory Visit | Attending: Family Medicine | Admitting: Family Medicine

## 2023-04-05 DIAGNOSIS — Z1231 Encounter for screening mammogram for malignant neoplasm of breast: Secondary | ICD-10-CM

## 2023-05-17 NOTE — Telephone Encounter (Signed)
 No further action needed.

## 2023-06-01 ENCOUNTER — Encounter: Payer: PPO | Admitting: Plastic Surgery

## 2023-07-20 ENCOUNTER — Other Ambulatory Visit: Payer: PPO

## 2023-08-24 ENCOUNTER — Ambulatory Visit (INDEPENDENT_AMBULATORY_CARE_PROVIDER_SITE_OTHER): Payer: PPO | Admitting: Plastic Surgery

## 2023-08-24 ENCOUNTER — Encounter: Payer: Self-pay | Admitting: Plastic Surgery

## 2023-08-24 DIAGNOSIS — Z719 Counseling, unspecified: Secondary | ICD-10-CM

## 2023-08-24 NOTE — Progress Notes (Signed)
 Botulinum Toxin Procedure Note  Procedure: Cosmetic botulinum toxin   Pre-operative Diagnosis: Dynamic rhytides   Post-operative Diagnosis: Same  Complications:  None  Brief history: The patient desires botulinum toxin injection of her forehead. I discussed with the patient this proposed procedure of botulinum toxin injections, which is customized depending on the particular needs of the patient. It is performed on facial rhytids as a temporary correction. The alternatives were discussed with the patient. The risks were addressed including bleeding, scarring, infection, damage to deeper structures, asymmetry, and chronic pain, which may occur infrequently after a procedure. The individual's choice to undergo a surgical procedure is based on the comparison of risks to potential benefits. Other risks include unsatisfactory results, brow ptosis, eyelid ptosis, allergic reaction, temporary paralysis, which should go away with time, bruising, blurring disturbances and delayed healing. Botulinum toxin injections do not arrest the aging process or produce permanent tightening of the eyelid.  Operative intervention maybe necessary to maintain the results of a blepharoplasty or botulinum toxin. The patient understands and wishes to proceed.  Procedure: The area was prepped with alcohol and dried with a clean gauze. Using a clean technique, the botulinum toxin was diluted with 1.25 cc of preservative-free normal saline which was slowly injected with an 18 gauge needle in a tuberculin syringes.  A 32 gauge needles were then used to inject the botulinum toxin. This mixture allow for an aliquot of 4 units per 0.1 cc in each injection site.    Location: lateral eyebrow as well as into each lateral canthal area beginning from the lateral orbital rim medial to the zygomaticus major in 3 separate areas. A total of 10 Units of botulinum toxin was used. No complications were noted. Light pressure was held for 5 minutes.  She was instructed explicitly in post-operative care.  Botox LOT:  1610

## 2023-08-25 ENCOUNTER — Ambulatory Visit
Admission: RE | Admit: 2023-08-25 | Discharge: 2023-08-25 | Disposition: A | Discharge status: Home / Self Care | Payer: PPO | Source: Ambulatory Visit | Attending: Family Medicine | Admitting: Family Medicine

## 2023-08-25 DIAGNOSIS — M8588 Other specified disorders of bone density and structure, other site: Secondary | ICD-10-CM | POA: Diagnosis not present

## 2023-08-25 DIAGNOSIS — M8589 Other specified disorders of bone density and structure, multiple sites: Secondary | ICD-10-CM

## 2023-08-25 DIAGNOSIS — N958 Other specified menopausal and perimenopausal disorders: Secondary | ICD-10-CM | POA: Diagnosis not present

## 2023-08-26 ENCOUNTER — Ambulatory Visit: Payer: Self-pay | Admitting: Family Medicine

## 2024-01-05 ENCOUNTER — Ambulatory Visit: Payer: Self-pay | Admitting: Family Medicine

## 2024-01-05 ENCOUNTER — Ambulatory Visit (INDEPENDENT_AMBULATORY_CARE_PROVIDER_SITE_OTHER): Payer: PPO | Admitting: Family Medicine

## 2024-01-05 ENCOUNTER — Encounter: Payer: Self-pay | Admitting: Family Medicine

## 2024-01-05 VITALS — BP 128/82 | HR 78 | Temp 97.9°F | Ht 66.0 in | Wt 150.4 lb

## 2024-01-05 DIAGNOSIS — Z13220 Encounter for screening for lipoid disorders: Secondary | ICD-10-CM | POA: Diagnosis not present

## 2024-01-05 DIAGNOSIS — E063 Autoimmune thyroiditis: Secondary | ICD-10-CM | POA: Diagnosis not present

## 2024-01-05 DIAGNOSIS — R030 Elevated blood-pressure reading, without diagnosis of hypertension: Secondary | ICD-10-CM | POA: Insufficient documentation

## 2024-01-05 DIAGNOSIS — M8589 Other specified disorders of bone density and structure, multiple sites: Secondary | ICD-10-CM

## 2024-01-05 DIAGNOSIS — Z131 Encounter for screening for diabetes mellitus: Secondary | ICD-10-CM

## 2024-01-05 DIAGNOSIS — Z23 Encounter for immunization: Secondary | ICD-10-CM | POA: Diagnosis not present

## 2024-01-05 DIAGNOSIS — Z0000 Encounter for general adult medical examination without abnormal findings: Secondary | ICD-10-CM | POA: Diagnosis not present

## 2024-01-05 DIAGNOSIS — Z1231 Encounter for screening mammogram for malignant neoplasm of breast: Secondary | ICD-10-CM | POA: Diagnosis not present

## 2024-01-05 DIAGNOSIS — E038 Other specified hypothyroidism: Secondary | ICD-10-CM

## 2024-01-05 LAB — COMPREHENSIVE METABOLIC PANEL WITH GFR
ALT: 12 U/L (ref 0–35)
AST: 18 U/L (ref 0–37)
Albumin: 4.4 g/dL (ref 3.5–5.2)
Alkaline Phosphatase: 55 U/L (ref 39–117)
BUN: 10 mg/dL (ref 6–23)
CO2: 32 meq/L (ref 19–32)
Calcium: 9.6 mg/dL (ref 8.4–10.5)
Chloride: 98 meq/L (ref 96–112)
Creatinine, Ser: 0.66 mg/dL (ref 0.40–1.20)
GFR: 86.13 mL/min (ref >60.00)
Glucose, Bld: 103 mg/dL — ABNORMAL HIGH (ref 70–99)
Potassium: 3.7 meq/L (ref 3.5–5.1)
Sodium: 138 meq/L (ref 135–145)
Total Bilirubin: 0.7 mg/dL (ref 0.2–1.2)
Total Protein: 7 g/dL (ref 6.0–8.3)

## 2024-01-05 LAB — LIPID PANEL
Cholesterol: 216 mg/dL — ABNORMAL HIGH (ref 0–200)
HDL: 68 mg/dL (ref >39.00)
LDL Cholesterol: 127 mg/dL — ABNORMAL HIGH (ref 0–99)
NonHDL: 147.53
Total CHOL/HDL Ratio: 3
Triglycerides: 105 mg/dL (ref 0.0–149.0)
VLDL: 21 mg/dL (ref 0.0–40.0)

## 2024-01-05 LAB — TSH: TSH: 2.38 u[IU]/mL (ref 0.35–5.50)

## 2024-01-05 LAB — CBC
HCT: 39.4 % (ref 36.0–46.0)
Hemoglobin: 13.1 g/dL (ref 12.0–15.0)
MCHC: 33.3 g/dL (ref 30.0–36.0)
MCV: 93.4 fl (ref 78.0–100.0)
Platelets: 258 K/uL (ref 150.0–400.0)
RBC: 4.22 Mil/uL (ref 3.87–5.11)
RDW: 13.2 % (ref 11.5–15.5)
WBC: 5.2 K/uL (ref 4.0–10.5)

## 2024-01-05 LAB — T4, FREE: Free T4: 1.41 ng/dL (ref 0.60–1.60)

## 2024-01-05 LAB — HEMOGLOBIN A1C: Hgb A1c MFr Bld: 6 % (ref 4.6–6.5)

## 2024-01-05 LAB — VITAMIN D 25 HYDROXY (VIT D DEFICIENCY, FRACTURES): VITD: 32.1 ng/mL (ref 30.00–100.00)

## 2024-01-05 MED ORDER — LEVOTHYROXINE SODIUM 112 MCG PO TABS: 112.0000 ug | ORAL_TABLET | Freq: Every day | ORAL | 3 refills | Status: AC

## 2024-01-05 MED ORDER — HYDROCHLOROTHIAZIDE 12.5 MG PO CAPS: 12.5000 mg | ORAL_CAPSULE | Freq: Every day | ORAL | 3 refills | Status: AC

## 2024-01-05 NOTE — Patient Instructions (Addendum)

## 2024-01-05 NOTE — Progress Notes (Signed)
 Patient ID: Paige Rasmussen, female  DOB: 1950/06/19, 73 y.o.   MRN: [id number] Patient Care Team    Relationship Specialty Notifications Start End  Catherine Charlies LABOR, DO PCP - General Family Medicine  07/25/20   Gennie Dover, MD Referring Physician Ophthalmology  12/27/20   Abran Norleen SAILOR, MD Consulting Physician Gastroenterology  01/05/24     Chief Complaint  Patient presents with   Annual Exam    Pt is fasting.  Chronic Conditions/illness Management Influenza vaccine- given    Subjective:  Paige Rasmussen is a 73 y.o.  Female  present for CPE and chronic condition management All past medical history, surgical history, allergies, family history, immunizations, medications and social history were updated in the electronic medical record today. All recent labs, ED visits and hospitalizations within the last year were reviewed.  Health maintenance:  Colonoscopy: completed 08/2015, by Dr. Abran. follow up 10 yrs. Mammogram: completed: 04/05/2023, [facility] placed order for 2025 Immunizations: tdap UTD 2023, Influenza administered today(encouraged yearly), PNA series completed, shingrix  series completed Infectious disease screening: Hep C  completed DEXA: Completed 08/25/2023 (-1.8) no significant change from prior screening.  Major fracture risk 12.1%, hip fracture risk 2.9% repeat 2 years  patient has a Dental home. Hospitalizations/ED visits: Reviewed      01/05/2024    8:19 AM 01/13/2023   11:40 AM 01/01/2023    7:55 AM 01/07/2022   10:19 AM 12/29/2021    8:30 AM  Depression screen PHQ 2/9  Decreased Interest 0 0 0 0 0  Down, Depressed, Hopeless 0 0 0 0 0  PHQ - 2 Score 0 0 0 0 0       No data to display           Immunization History  Administered Date(s) Administered   Fluad Quad(high Dose 65+) 12/07/2018, 01/18/2020, 12/27/2020, 02/05/2022   Fluad Trivalent(High Dose 65+) 01/01/2023   INFLUENZA, HIGH DOSE SEASONAL PF 02/04/2017, 01/25/2018,  01/05/2024   Influenza,inj,Quad PF,6+ Mos 01/24/2015, 03/03/2016   Influenza-Unspecified 04/10/2011   PFIZER(Purple Top)SARS-COV-2 Vaccination 05/20/2019, 06/10/2019   Pneumococcal Conjugate-13 06/18/2014   Pneumococcal Polysaccharide-23 06/25/2015   Td 04/10/2011   Tdap 04/10/2011, 06/03/2021   Zoster Recombinant(Shingrix ) 07/29/2021, 12/23/2021   Zoster, Live 12/31/2010    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   Colon polyps    Heart murmur    Murmur    Osteopenia    Thyroid  disease    UTI (urinary tract infection)    Allergies  Allergen Reactions   Penicillins     REACTION: eyes, lips, and joints swell   Codeine     REACTION: nausea and vomiting   Oxycodone Itching   Past Surgical History:  Procedure Laterality Date   BREAST CYST ASPIRATION     BREAST EXCISIONAL BIOPSY Right    benign   FINGER SURGERY     left ring finger   TONSILLECTOMY     TUBAL LIGATION     WRIST SURGERY     left wrist   Family History  Problem Relation Age of Onset   Arthritis Mother    COPD Mother    Hearing loss Mother    Multiple myeloma Father    Arthritis Sister    Diabetes Sister    Hearing loss Brother    Diabetes Brother    Hearing loss Son    Cancer Paternal Grandfather    Social History   Social History Narrative  Marital status/children/pets: widowed. 1 Son.    Education/employment: 1 yr college. Retired   Field seismologist:      -smoke alarm in the home:Yes     - wears seatbelt: Yes     - Feels safe in their relationships: Yes       Allergies as of 01/05/2024       Reactions   Penicillins    REACTION: eyes, lips, and joints swell   Codeine    REACTION: nausea and vomiting   Oxycodone Itching        Medication List        Accurate as of January 05, 2024  8:20 AM. If you have any questions, ask your nurse or doctor.          ascorbic acid 500 MG tablet Commonly known as: VITAMIN C Take 1,000 mg by mouth daily.    calcium-vitamin D  500-200 MG-UNIT tablet Take 2 tablets by mouth. Take 2 pills daily   glucosamine-chondroitin 500-400 MG tablet Take 1 tablet by mouth daily.   hydrochlorothiazide  12.5 MG capsule Commonly known as: MICROZIDE  Take 1 capsule (12.5 mg total) by mouth daily.   levothyroxine  112 MCG tablet Commonly known as: SYNTHROID  Take 1 tablet (112 mcg total) by mouth daily.        All past medical history, surgical history, allergies, family history, immunizations andmedications were updated in the EMR today and reviewed under the history and medication portions of their EMR.     No results found for this or any previous visit (from the past 2160 hours).    ROS 14 pt review of systems performed and negative (unless mentioned in an HPI)  Objective: BP 128/82   Pulse 78   Temp 97.9 F (36.6 C)   Ht 5' 6 (1.676 m)   Wt 150 lb 6.4 oz (68.2 kg)   SpO2 98%   BMI 24.28 kg/m  Physical Exam Vitals and nursing note reviewed.  Constitutional:      General: She is not in acute distress.    Appearance: Normal appearance. She is not ill-appearing or toxic-appearing.     Comments: Very pleasant female  HENT:     Head: Normocephalic and atraumatic.     Right Ear: Tympanic membrane, ear canal and external ear normal. There is no impacted cerumen.     Left Ear: Tympanic membrane, ear canal and external ear normal. There is no impacted cerumen.     Nose: No congestion or rhinorrhea.     Mouth/Throat:     Mouth: Mucous membranes are moist.     Pharynx: Oropharynx is clear. No oropharyngeal exudate or posterior oropharyngeal erythema.  Eyes:     General: No scleral icterus.       Right eye: No discharge.        Left eye: No discharge.     Extraocular Movements: Extraocular movements intact.     Conjunctiva/sclera: Conjunctivae normal.     Pupils: Pupils are equal, round, and reactive to light.  Cardiovascular:     Rate and Rhythm: Normal rate and regular rhythm.      Pulses: Normal pulses.     Heart sounds: Normal heart sounds. No murmur heard.    No friction rub. No gallop.  Pulmonary:     Effort: Pulmonary effort is normal. No respiratory distress.     Breath sounds: Normal breath sounds. No stridor. No wheezing, rhonchi or rales.  Chest:     Chest wall: No tenderness.  Abdominal:  General: Abdomen is flat. Bowel sounds are normal. There is no distension.     Palpations: Abdomen is soft. There is no mass.     Tenderness: There is no abdominal tenderness. There is no right CVA tenderness, left CVA tenderness, guarding or rebound.     Hernia: No hernia is present.  Musculoskeletal:        General: No swelling, tenderness or deformity. Normal range of motion.     Cervical back: Normal range of motion and neck supple. No rigidity or tenderness.     Right lower leg: No edema.     Left lower leg: No edema.  Lymphadenopathy:     Cervical: No cervical adenopathy.  Skin:    General: Skin is warm and dry.     Coloration: Skin is not jaundiced or pale.     Findings: No bruising, erythema, lesion or rash.  Neurological:     General: No focal deficit present.     Mental Status: She is alert and oriented to person, place, and time. Mental status is at baseline.     Cranial Nerves: No cranial nerve deficit.     Sensory: No sensory deficit.     Motor: No weakness.     Coordination: Coordination normal.     Gait: Gait normal.     Deep Tendon Reflexes: Reflexes normal.  Psychiatric:        Mood and Affect: Mood normal.        Behavior: Behavior normal.        Thought Content: Thought content normal.        Judgment: Judgment normal.      No results found.  Assessment/plan: Paige Rasmussen is a 73 y.o. female present for CPE and chronic condition management Hypothyroidism due to acquired atrophy of thyroid  Continue levo 112 mcg, refills after results in appropriate dose.  TSH collected today  Osteopenia of multiple sites Dexa utd  2025-2-year follow-up: T-score: -1.8 Major fracture risk: 12.1% Hip fracture risk: 2. 9% Ca/vitd supplement recommended.  Vitamin D  collected today  Osteopenia of multiple sites - Vitamin D  (25 hydroxy)  Influenza vaccine needed - Flu vaccine HIGH DOSE PF(Fluzone Trivalent) Breast cancer screening by mammogram - MM 3D SCREENING MAMMOGRAM BILATERAL BREAST; Future Lipid screening - Lipid panel Diabetes mellitus screening - Hemoglobin A1c  Prehypertension Stable Continue hydrochlorothiazide  12.5 mg qd - Hemoglobin A1c - Lipid panel  Routine general medical examination at a health care facility (Primary) - CBC - Comprehensive metabolic panel with GFR Patient was encouraged to exercise greater than 150 minutes a week. Patient was encouraged to choose a diet filled with fresh fruits and vegetables, and lean meats. AVS provided to patient today for education/recommendation on gender specific health and safety maintenance. Colonoscopy: completed 08/2015, by Dr. Abran. follow up 10 yrs. Mammogram: completed: 04/05/2023, [facility] placed order for 2025 Immunizations: tdap UTD 2023, Influenza administered (encouraged yearly), PNA series completed, shingrix  series completed  Return in about 1 year (around 01/05/2025) for cpe (20 min), Routine chronic condition follow-up.  Orders Placed This Encounter  Procedures   MM 3D SCREENING MAMMOGRAM BILATERAL BREAST   Flu vaccine HIGH DOSE PF(Fluzone Trivalent)   CBC   Comprehensive metabolic panel with GFR   Hemoglobin A1c   Lipid panel   TSH   Vitamin D  (25 hydroxy)   T4, free   Meds ordered this encounter  Medications   hydrochlorothiazide  (MICROZIDE ) 12.5 MG capsule    Sig: Take 1 capsule (12.5 mg total) by  mouth daily.    Dispense:  90 capsule    Refill:  3   Referral Orders  No referral(s) requested today     Electronically signed by: Charlies Bellini, DO [facility]

## 2024-01-19 ENCOUNTER — Ambulatory Visit (INDEPENDENT_AMBULATORY_CARE_PROVIDER_SITE_OTHER): Payer: PPO | Admitting: *Deleted

## 2024-01-19 VITALS — Ht 65.0 in | Wt 150.0 lb

## 2024-01-19 DIAGNOSIS — Z0000 Encounter for general adult medical examination without abnormal findings: Secondary | ICD-10-CM

## 2024-01-19 NOTE — Progress Notes (Signed)
 Subjective:   Paige Rasmussen is a 73 y.o. female who presents for Medicare Annual (Subsequent) preventive examination.  Visit Complete: Virtual I connected with  Paige Rasmussen on 01/19/24 by a video and audio enabled telemedicine application and verified that I am speaking with the correct person using two identifiers.  Patient Location: Home  Provider Location: Home Office  I discussed the limitations of evaluation and management by telemedicine. The patient expressed understanding and agreed to proceed.  Vital Signs: Because this visit was a virtual/telehealth visit, some criteria may be missing or patient reported. Any vitals not documented were not able to be obtained and vitals that have been documented are patient reported.  Patient Medicare AWV questionnaire was completed by the patient on [phone]; I have confirmed that all information answered by patient is correct and no changes since this date.  Cardiac Risk Factors include: advanced age (>44men, >75 women)     Objective:    Today's Vitals   01/19/24 1053  Weight: 150 lb (68 kg)  Height: 5' 5 (1.651 m)   Body mass index is 24.96 kg/m.     01/19/2024   10:50 AM 01/13/2023   11:39 AM 01/07/2022   10:20 AM 01/01/2021   11:47 AM 12/13/2019    9:34 AM 10/21/2018   11:45 AM 10/11/2018   10:27 AM  Advanced Directives  Does Patient Have a Medical Advance Directive? Yes Yes Yes Yes Yes Yes Yes  Type of Estate agent of [employer] Power of [place];Living will Healthcare Power of Fate;Living will Healthcare Power of [employer] of [place];Living will Living will Living will;Healthcare Power of Attorney  Does patient want to make changes to medical advance directive?     No - Patient declined  No - Patient declined   Copy of Healthcare Power of Attorney in Chart? No - copy requested No - copy requested No - copy requested No - copy requested No - copy requested   No - copy requested      Data saved with a previous flowsheet row definition    Current Medications (verified) Outpatient Encounter Medications as of 01/19/2024  Medication Sig   Calcium Carbonate-Vitamin D  (CALCIUM-VITAMIN D ) 500-200 MG-UNIT per tablet Take 2 tablets by mouth. Take 2 pills daily   glucosamine-chondroitin 500-400 MG tablet Take 1 tablet by mouth daily.    hydrochlorothiazide  (MICROZIDE ) 12.5 MG capsule Take 1 capsule (12.5 mg total) by mouth daily.   levothyroxine  (SYNTHROID ) 112 MCG tablet Take 1 tablet (112 mcg total) by mouth daily.   vitamin C (ASCORBIC ACID) 500 MG tablet Take 1,000 mg by mouth daily.    No facility-administered encounter medications on file as of 01/19/2024.    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   Colon polyps    Heart murmur    Murmur    Osteopenia    Thyroid  disease    UTI (urinary tract infection)    Past Surgical History:  Procedure Laterality Date   BREAST CYST ASPIRATION     BREAST EXCISIONAL BIOPSY Right    benign   FINGER SURGERY     left ring finger   TONSILLECTOMY     TUBAL LIGATION     WRIST SURGERY     left wrist   Family History  Problem Relation Age of Onset   Arthritis Mother    COPD Mother  Hearing loss Mother    Multiple myeloma Father    Arthritis Sister    Diabetes Sister    Hearing loss Brother    Diabetes Brother    Hearing loss Son    Cancer Paternal Grandfather    Social History   Socioeconomic History   Marital status: Widowed    Spouse name: Not on file   Number of children: Not on file   Years of education: Not on file   Highest education level: Some college, no degree  Occupational History   Not on file  Tobacco Use   Smoking status: Former    Current packs/day: 0.00    Types: Cigarettes    Quit date: 11/30/1982    Years since quitting: 41.1    Passive exposure: Never   Smokeless  tobacco: Never  Vaping Use   Vaping status: Never Used  Substance and Sexual Activity   Alcohol use: Yes    Comment: socially   Drug use: Never   Sexual activity: Not Currently  Other Topics Concern   Not on file  Social History Narrative   Marital status/children/pets: widowed. 1 Son.    Education/employment: 1 yr college. Retired   Field seismologist:      -smoke alarm in the home:Yes     - wears seatbelt: Yes     - Feels safe in their relationships: Yes      Social Drivers of Corporate investment banker Strain: Low Risk  (01/19/2024)   Overall Financial Resource Strain (CARDIA)    Difficulty of Paying Living Expenses: Not very hard  Food Insecurity: No Food Insecurity (01/19/2024)   Hunger Vital Sign    Worried About Running Out of Food in the Last Year: Never true    Ran Out of Food in the Last Year: Never true  Transportation Needs: No Transportation Needs (01/19/2024)   PRAPARE - Administrator, Civil Service (Medical): No    Lack of Transportation (Non-Medical): No  Physical Activity: Insufficiently Active (01/19/2024)   Exercise Vital Sign    Days of Exercise per Week: 5 days    Minutes of Exercise per Session: 20 min  Stress: No Stress Concern Present (01/19/2024)   [employer] of Occupational Health - Occupational Stress Questionnaire    Feeling of Stress: Not at all  Social Connections: Moderately Isolated (01/19/2024)   Social Connection and Isolation Panel    Frequency of Communication with Friends and Family: Once a week    Frequency of Social Gatherings with Friends and Family: Twice a week    Attends Religious Services: 1 to 4 times per year    Active Member of [employer] or Organizations: No    Attends Banker Meetings: Not on file    Marital Status: Widowed    Tobacco Counseling Counseling given: Not Answered   Clinical Intake:  Pre-visit preparation completed: Yes  Pain : No/denies pain     Diabetes: No  How often do you need to  have someone help you when you read instructions, pamphlets, or other written materials from your doctor or pharmacy?: 1 - Never  Interpreter Needed?: No  Information entered by :: Mliss Graff LPN   Activities of Daily Living    01/19/2024   11:02 AM 01/15/2024   12:09 PM  In your present state of health, do you have any difficulty performing the following activities:  Hearing? 0 0  Vision? 0 0  Difficulty concentrating or making decisions? 0 0  Walking  or climbing stairs? 0 0  Dressing or bathing? 0 0  Doing errands, shopping? 0 0  Preparing Food and eating ? N N  Using the Toilet? N N  In the past six months, have you accidently leaked urine? N N  Do you have problems with loss of bowel control? N N  Managing your Medications? N N  Managing your Finances? N N  Housekeeping or managing your Housekeeping? N N    Patient Care Team: Catherine Charlies LABOR, DO as PCP - General (Family Medicine) Gennie Dover, MD as Referring Physician (Ophthalmology) Abran Norleen SAILOR, MD as Consulting Physician (Gastroenterology)  Indicate any recent Medical Services you may have received from other than [facility] providers in the past year (date may be approximate).     Assessment:   This is a routine wellness examination for Paige Rasmussen.  Hearing/Vision screen Hearing Screening - Comments:: No trouble hearing Vision Screening - Comments:: [facility] date [facility]  [place]    Goals Addressed             This Visit's Progress    Patient Stated   On track    Continue current lifestyle      Patient Stated   On track    Maintain health and exercise      Patient Stated   On track    Maintain healthy state      Patient Stated       Continue current lifestyle       Depression Screen    01/19/2024   10:53 AM 01/05/2024    8:19 AM 01/13/2023   11:40 AM 01/01/2023    7:55 AM 01/07/2022   10:19 AM 12/29/2021    8:30 AM 01/01/2021   11:46 AM  PHQ 2/9 Scores  PHQ - 2 Score 0 0 0 0 0 0 0  PHQ- 9 Score 0           Fall Risk    01/19/2024   10:52 AM 01/15/2024   12:09 PM 01/05/2024    8:19 AM 01/11/2023   12:10 AM 01/01/2023    7:55 AM  Fall Risk   Falls in the past year? 1 1 0 0 1  Number falls in past yr: 0 0 0  0  Injury with Fall? 0 0 0 0 0  Risk for fall due to :    No Fall Risks No Fall Risks  Follow up Falls evaluation completed;Education provided;Falls prevention discussed  Falls evaluation completed Falls prevention discussed Falls evaluation completed    MEDICARE RISK AT HOME: Medicare Risk at Home Any stairs in or around the home?: No Home free of loose throw rugs in walkways, pet beds, electrical cords, etc?: Yes Adequate lighting in your home to reduce risk of falls?: Yes Life alert?: No Use of a cane, walker or w/c?: No Grab bars in the bathroom?: No Shower chair or bench in shower?: No Elevated toilet seat or a handicapped toilet?: No  TIMED UP AND GO:  Was the test performed?  No    Cognitive Function:        01/19/2024   10:52 AM 01/13/2023    7:46 AM 01/07/2022   10:22 AM  6CIT Screen  What Year? 0 points 0 points 0 points  What month? 0 points 0 points 0 points  What time? 0 points 0 points 0 points  Count back from 20 0 points 0 points 0 points  Months in reverse 0 points 0 points 0 points  Repeat phrase 0 points 0 points 0 points  Total Score 0 points 0 points 0 points    Immunizations Immunization History  Administered Date(s) Administered   Fluad Quad(high Dose 65+) 12/07/2018, 01/18/2020, 12/27/2020, 02/05/2022   Fluad Trivalent(High Dose 65+) 01/01/2023   INFLUENZA, HIGH DOSE SEASONAL PF 02/04/2017, 01/25/2018, 01/05/2024   Influenza,inj,Quad PF,6+ Mos 01/24/2015, 03/03/2016   Influenza-Unspecified 04/10/2011   PFIZER(Purple Top)SARS-COV-2 Vaccination 05/20/2019, 06/10/2019   Pneumococcal Conjugate-13 06/18/2014   Pneumococcal Polysaccharide-23 06/25/2015   Td 04/10/2011   Tdap 04/10/2011, 06/03/2021   Zoster Recombinant(Shingrix )  07/29/2021, 12/23/2021   Zoster, Live 12/31/2010    TDAP status: Up to date  Flu Vaccine status: Up to date  Pneumococcal vaccine status: Up to date  Covid-19 vaccine status: Declined, Education has been provided regarding the importance of this vaccine but patient still declined. Advised may receive this vaccine at local pharmacy or Health Dept.or vaccine clinic. Aware to provide a copy of the vaccination record if obtained from local pharmacy or Health Dept. Verbalized acceptance and understanding.  Qualifies for Shingles Vaccine? No   Zostavax completed Yes   Shingrix  Completed?: Yes  Screening Tests Health Maintenance  Topic Date Due   Mammogram  04/04/2024   Medicare Annual Wellness (AWV)  01/18/2025   Colonoscopy  09/02/2025   DEXA SCAN  08/25/2026   DTaP/Tdap/Td (4 - Td or Tdap) 06/04/2031   Pneumococcal Vaccine: 50+ Years  Completed   Influenza Vaccine  Completed   Hepatitis C Screening  Completed   Zoster Vaccines- Shingrix   Completed   Meningococcal B Vaccine  Aged Out   COVID-19 Vaccine  Discontinued    Health Maintenance  There are no preventive care reminders to display for this patient.   Colorectal cancer screening: Type of screening: Colonoscopy. Completed 2017. Repeat every 10 years  Mammogram status: Completed  . Repeat every year  Bone Density status: Completed 2025. Results reflect: Bone density results: OSTEOPENIA. Repeat every 2 years.  Lung Cancer Screening: (Low Dose CT Chest recommended if Age 43-80 years, 20 pack-year currently smoking OR have quit w/in 15years.) does not qualify.   Lung Cancer Screening Referral:   Additional Screening:  Hepatitis C Screening: does not qualify; Completed 2022  Vision Screening: Recommended annual ophthalmology exams for early detection of glaucoma and other disorders of the eye. Is the patient up to date with their annual eye exam?  Yes  Who is the provider or what is the name of the office in which the  patient attends annual eye exams? [facility] If pt is not established with a provider, would they like to be referred to a provider to establish care? No .   Dental Screening: Recommended annual dental exams for proper oral hygiene    Community Resource Referral / Chronic Care Management: CRR required this visit?  No   CCM required this visit?  No     Plan:     I have personally reviewed and noted the following in the patient's chart:   Medical and social history Use of alcohol, tobacco or illicit drugs  Current medications and supplements including opioid prescriptions. Patient is not currently taking opioid prescriptions. Functional ability and status Nutritional status Physical activity Advanced directives List of other physicians Hospitalizations, surgeries, and ER visits in previous 12 months Vitals Screenings to include cognitive, depression, and falls Referrals and appointments  In addition, I have reviewed and discussed with patient certain preventive protocols, quality metrics, and best practice recommendations. A written personalized care plan  for preventive services as well as general preventive health recommendations were provided to patient.     Mliss Graff, LPN   [id number]   After Visit Summary: (MyChart) Due to this being a telephonic visit, the after visit summary with patients personalized plan was offered to patient via MyChart   Nurse Notes:

## 2024-01-19 NOTE — Patient Instructions (Signed)
 [name] , Thank you for taking time to come for your Medicare Wellness Visit. I appreciate your ongoing commitment to your health goals. Please review the following plan we discussed and let me know if I can assist you in the future.   Screening recommendations/referrals: Colonoscopy: up to date Mammogram:  Bone Density: up to date Recommended yearly ophthalmology/optometry visit for glaucoma screening and checkup Recommended yearly dental visit for hygiene and checkup  Vaccinations: Influenza vaccine: up to date Pneumococcal vaccine: up to date Tdap vaccine: up to date Shingles vaccine: up to date        Preventive Care 65 Years and Older, Female Preventive care refers to lifestyle choices and visits with your health care provider that can promote health and wellness. What does preventive care include? A yearly physical exam. This is also called an annual well check. Dental exams once or twice a year. Routine eye exams. Ask your health care provider how often you should have your eyes checked. Personal lifestyle choices, including: Daily care of your teeth and gums. Regular physical activity. Eating a healthy diet. Avoiding tobacco and drug use. Limiting alcohol use. Practicing safe sex. Taking low-dose aspirin every day. Taking vitamin and mineral supplements as recommended by your health care provider. What happens during an annual well check? The services and screenings done by your health care provider during your annual well check will depend on your age, overall health, lifestyle risk factors, and family history of disease. Counseling  Your health care provider may ask you questions about your: Alcohol use. Tobacco use. Drug use. Emotional well-being. Home and relationship well-being. Sexual activity. Eating habits. History of falls. Memory and ability to understand (cognition). Work and work Astronomer. Reproductive health. Screening  You may have the  following tests or measurements: Height, weight, and BMI. Blood pressure. Lipid and cholesterol levels. These may be checked every 5 years, or more frequently if you are over 7 years old. Skin check. Lung cancer screening. You may have this screening every year starting at age 18 if you have a 30-pack-year history of smoking and currently smoke or have quit within the past 15 years. Fecal occult blood test (FOBT) of the stool. You may have this test every year starting at age 52. Flexible sigmoidoscopy or colonoscopy. You may have a sigmoidoscopy every 5 years or a colonoscopy every 10 years starting at age 27. Hepatitis C blood test. Hepatitis B blood test. Sexually transmitted disease (STD) testing. Diabetes screening. This is done by checking your blood sugar (glucose) after you have not eaten for a while (fasting). You may have this done every 1-3 years. Bone density scan. This is done to screen for osteoporosis. You may have this done starting at age 23. Mammogram. This may be done every 1-2 years. Talk to your health care provider about how often you should have regular mammograms. Talk with your health care provider about your test results, treatment options, and if necessary, the need for more tests. Vaccines  Your health care provider may recommend certain vaccines, such as: Influenza vaccine. This is recommended every year. Tetanus, diphtheria, and acellular pertussis (Tdap, Td) vaccine. You may need a Td booster every 10 years. Zoster vaccine. You may need this after age 89. Pneumococcal 13-valent conjugate (PCV13) vaccine. One dose is recommended after age 26. Pneumococcal polysaccharide (PPSV23) vaccine. One dose is recommended after age 60. Talk to your health care provider about which screenings and vaccines you need and how often you need them. This  information is not intended to replace advice given to you by your health care provider. Make sure you discuss any questions you  have with your health care provider. Document Released: 04/26/2015 Document Revised: 12/18/2015 Document Reviewed: 01/29/2015 Elsevier Interactive Patient Education  2017 [employer].  Fall Prevention in the Home Falls can cause injuries. They can happen to people of all ages. There are many things you can do to make your home safe and to help prevent falls. What can I do on the outside of my home? Regularly fix the edges of walkways and driveways and fix any cracks. Remove anything that might make you trip as you walk through a door, such as a raised step or threshold. Trim any bushes or trees on the path to your home. Use bright outdoor lighting. Clear any walking paths of anything that might make someone trip, such as rocks or tools. Regularly check to see if handrails are loose or broken. Make sure that both sides of any steps have handrails. Any raised decks and porches should have guardrails on the edges. Have any leaves, snow, or ice cleared regularly. Use sand or salt on walking paths during winter. Clean up any spills in your garage right away. This includes oil or grease spills. What can I do in the bathroom? Use night lights. Install grab bars by the toilet and in the tub and shower. Do not use towel bars as grab bars. Use non-skid mats or decals in the tub or shower. If you need to sit down in the shower, use a plastic, non-slip stool. Keep the floor dry. Clean up any water that spills on the floor as soon as it happens. Remove soap buildup in the tub or shower regularly. Attach bath mats securely with double-sided non-slip rug tape. Do not have throw rugs and other things on the floor that can make you trip. What can I do in the bedroom? Use night lights. Make sure that you have a light by your bed that is easy to reach. Do not use any sheets or blankets that are too big for your bed. They should not hang down onto the floor. Have a firm chair that has side arms. You can  use this for support while you get dressed. Do not have throw rugs and other things on the floor that can make you trip. What can I do in the kitchen? Clean up any spills right away. Avoid walking on wet floors. Keep items that you use a lot in easy-to-reach places. If you need to reach something above you, use a strong step stool that has a grab bar. Keep electrical cords out of the way. Do not use floor polish or wax that makes floors slippery. If you must use wax, use non-skid floor wax. Do not have throw rugs and other things on the floor that can make you trip. What can I do with my stairs? Do not leave any items on the stairs. Make sure that there are handrails on both sides of the stairs and use them. Fix handrails that are broken or loose. Make sure that handrails are as long as the stairways. Check any carpeting to make sure that it is firmly attached to the stairs. Fix any carpet that is loose or worn. Avoid having throw rugs at the top or bottom of the stairs. If you do have throw rugs, attach them to the floor with carpet tape. Make sure that you have a light switch at the top  of the stairs and the bottom of the stairs. If you do not have them, ask someone to add them for you. What else can I do to help prevent falls? Wear shoes that: Do not have high heels. Have rubber bottoms. Are comfortable and fit you well. Are closed at the toe. Do not wear sandals. If you use a stepladder: Make sure that it is fully opened. Do not climb a closed stepladder. Make sure that both sides of the stepladder are locked into place. Ask someone to hold it for you, if possible. Clearly mark and make sure that you can see: Any grab bars or handrails. First and last steps. Where the edge of each step is. Use tools that help you move around (mobility aids) if they are needed. These include: Canes. Walkers. Scooters. Crutches. Turn on the lights when you go into a dark area. Replace any light  bulbs as soon as they burn out. Set up your furniture so you have a clear path. Avoid moving your furniture around. If any of your floors are uneven, fix them. If there are any pets around you, be aware of where they are. Review your medicines with your doctor. Some medicines can make you feel dizzy. This can increase your chance of falling. Ask your doctor what other things that you can do to help prevent falls. This information is not intended to replace advice given to you by your health care provider. Make sure you discuss any questions you have with your health care provider. Document Released: 01/24/2009 Document Revised: 09/05/2015 Document Reviewed: 05/04/2014 Elsevier Interactive Patient Education  2017 [employer].

## 2024-04-04 ENCOUNTER — Ambulatory Visit (INDEPENDENT_AMBULATORY_CARE_PROVIDER_SITE_OTHER): Payer: Self-pay | Admitting: Plastic Surgery

## 2024-04-04 DIAGNOSIS — Z719 Counseling, unspecified: Secondary | ICD-10-CM

## 2024-04-04 NOTE — Progress Notes (Signed)

## 2024-04-07 ENCOUNTER — Ambulatory Visit
Admission: RE | Admit: 2024-04-07 | Discharge: 2024-04-07 | Disposition: A | Discharge status: Home / Self Care | Source: Ambulatory Visit | Attending: Family Medicine | Admitting: Family Medicine

## 2024-04-07 DIAGNOSIS — Z1231 Encounter for screening mammogram for malignant neoplasm of breast: Secondary | ICD-10-CM

## 2025-01-08 ENCOUNTER — Encounter: Admitting: Family Medicine

## 2025-01-24 ENCOUNTER — Ambulatory Visit
